# Patient Record
Sex: Male | Born: 1960 | Race: Asian | Hispanic: No | Marital: Married | Smoking: Never smoker
Health system: Southern US, Community
[De-identification: ages and names within clinical notes are randomized; demographics above are authoritative.]

## PROBLEM LIST (undated history)

## (undated) DIAGNOSIS — T7840XA Allergy, unspecified, initial encounter: Secondary | ICD-10-CM

## (undated) DIAGNOSIS — E119 Type 2 diabetes mellitus without complications: Secondary | ICD-10-CM

## (undated) DIAGNOSIS — I10 Essential (primary) hypertension: Secondary | ICD-10-CM

## (undated) DIAGNOSIS — R011 Cardiac murmur, unspecified: Secondary | ICD-10-CM

## (undated) DIAGNOSIS — E079 Disorder of thyroid, unspecified: Secondary | ICD-10-CM

## (undated) HISTORY — PX: COARCTATION OF AORTA EXCISION: SUR504

## (undated) HISTORY — DX: Allergy, unspecified, initial encounter: T78.40XA

## (undated) HISTORY — DX: Type 2 diabetes mellitus without complications: E11.9

## (undated) HISTORY — DX: Disorder of thyroid, unspecified: E07.9

## (undated) HISTORY — DX: Essential (primary) hypertension: I10

## (undated) HISTORY — DX: Cardiac murmur, unspecified: R01.1

---

## 2012-02-07 ENCOUNTER — Other Ambulatory Visit: Payer: Self-pay | Admitting: Internal Medicine

## 2012-05-04 ENCOUNTER — Other Ambulatory Visit: Payer: Self-pay | Admitting: Endocrinology

## 2012-05-04 DIAGNOSIS — E039 Hypothyroidism, unspecified: Secondary | ICD-10-CM

## 2012-05-08 ENCOUNTER — Other Ambulatory Visit: Payer: Self-pay | Admitting: Family Medicine

## 2012-05-09 NOTE — Telephone Encounter (Signed)
Needs office visit.

## 2012-05-15 ENCOUNTER — Other Ambulatory Visit: Payer: Self-pay

## 2012-09-13 ENCOUNTER — Ambulatory Visit (INDEPENDENT_AMBULATORY_CARE_PROVIDER_SITE_OTHER): Payer: 59 | Admitting: Family Medicine

## 2012-09-13 VITALS — BP 128/80 | HR 75 | Temp 98.3°F | Resp 16 | Ht 64.0 in | Wt 156.6 lb

## 2012-09-13 DIAGNOSIS — E119 Type 2 diabetes mellitus without complications: Secondary | ICD-10-CM

## 2012-09-13 DIAGNOSIS — E1165 Type 2 diabetes mellitus with hyperglycemia: Secondary | ICD-10-CM

## 2012-09-13 DIAGNOSIS — K219 Gastro-esophageal reflux disease without esophagitis: Secondary | ICD-10-CM

## 2012-09-13 DIAGNOSIS — E039 Hypothyroidism, unspecified: Secondary | ICD-10-CM

## 2012-09-13 DIAGNOSIS — E785 Hyperlipidemia, unspecified: Secondary | ICD-10-CM

## 2012-09-13 DIAGNOSIS — I10 Essential (primary) hypertension: Secondary | ICD-10-CM

## 2012-09-13 DIAGNOSIS — Z0000 Encounter for general adult medical examination without abnormal findings: Secondary | ICD-10-CM

## 2012-09-13 DIAGNOSIS — Z79899 Other long term (current) drug therapy: Secondary | ICD-10-CM

## 2012-09-13 HISTORY — DX: Essential (primary) hypertension: I10

## 2012-09-13 HISTORY — DX: Other long term (current) drug therapy: Z79.899

## 2012-09-13 HISTORY — DX: Type 2 diabetes mellitus with hyperglycemia: E11.65

## 2012-09-13 HISTORY — DX: Hypothyroidism, unspecified: E03.9

## 2012-09-13 HISTORY — DX: Hyperlipidemia, unspecified: E78.5

## 2012-09-13 LAB — COMPREHENSIVE METABOLIC PANEL
ALT: 17 U/L (ref 0–53)
AST: 16 U/L (ref 0–37)
Albumin: 4.6 g/dL (ref 3.5–5.2)
CO2: 28 mEq/L (ref 19–32)
Calcium: 9.5 mg/dL (ref 8.4–10.5)
Chloride: 103 mEq/L (ref 96–112)
Creat: 0.9 mg/dL (ref 0.50–1.35)
Potassium: 4.2 mEq/L (ref 3.5–5.3)

## 2012-09-13 LAB — LIPID PANEL: Cholesterol: 138 mg/dL (ref 0–200)

## 2012-09-13 LAB — TSH: TSH: 2.19 u[IU]/mL (ref 0.350–4.500)

## 2012-09-13 LAB — POCT GLYCOSYLATED HEMOGLOBIN (HGB A1C): Hemoglobin A1C: 5.4

## 2012-09-13 MED ORDER — LEVOTHYROXINE SODIUM 75 MCG PO TABS: 75.0000 ug | ORAL_TABLET | Freq: Every day | ORAL | Status: DC

## 2012-09-13 MED ORDER — LEVOXYL 75 MCG PO TABS: 75.0000 ug | ORAL_TABLET | Freq: Every day | ORAL | Status: DC

## 2012-09-13 MED ORDER — DESOXIMETASONE 0.25 % EX CREA: TOPICAL_CREAM | Freq: Two times a day (BID) | CUTANEOUS | Status: DC

## 2012-09-13 MED ORDER — OMEPRAZOLE 40 MG PO CPDR: 40.0000 mg | DELAYED_RELEASE_CAPSULE | Freq: Every day | ORAL | Status: DC

## 2012-09-13 NOTE — Progress Notes (Signed)
Subjective:    Patient ID: Troy Valentine, male    DOB: 16-Jun-1961, 51 y.o.   MRN: [id number]  HPI  Pt is a pleasant 51 yo male who is here for a routine physical for his insurance company.  He has DMII that is well controled on low dose metformin and followed by endocrinology along with his hypothyroid. He also has some well controlled HTN and HPL that is followed by cardiology.  He has appts with both of his specialists shortly but would appreciate med refill for 1 mo to last until his appt.  Past Medical History  Diagnosis Date  . Hypertension    History reviewed. No pertinent past surgical history. History reviewed. No pertinent family history. History  Substance Use Topics  . Smoking status: Never Smoker   . Smokeless tobacco: Not on file  . Alcohol Use: No   Current Outpatient Prescriptions on File Prior to Visit  Medication Sig Dispense Refill  . lisinopril (PRINIVIL,ZESTRIL) 20 MG tablet Take 20 mg by mouth daily.      . metFORMIN (GLUCOPHAGE-XR) 500 MG 24 hr tablet TAKE 2 TABLETS ONCE A DAY  60 tablet  0  . simvastatin (ZOCOR) 40 MG tablet Take 40 mg by mouth every evening.      Marland Kitchen omeprazole (PRILOSEC) 40 MG capsule Take 1 capsule (40 mg total) by mouth daily.  30 capsule  3  No Known Allergies    Review of Systems  Constitutional: Negative for fever, chills, activity change, appetite change and fatigue.  HENT: Negative for ear pain, congestion and sore throat.   Eyes: Negative for visual disturbance.  Respiratory: Negative for cough and shortness of breath.   Cardiovascular: Negative for chest pain and leg swelling.  Gastrointestinal: Negative for nausea, vomiting, abdominal pain, diarrhea and constipation.  Genitourinary: Negative for dysuria.  Musculoskeletal: Negative for myalgias, arthralgias and gait problem.  Skin: Negative for rash.  Neurological: Negative for dizziness, weakness, numbness and headaches.  Psychiatric/Behavioral: Negative for sleep  disturbance.      BP 128/80  Pulse 75  Temp 98.3 F (36.8 C) (Oral)  Resp 16  Ht 5\' 4"  (1.626 m)  Wt 156 lb 9.6 oz (71.033 kg)  BMI 26.88 kg/m2  SpO2 100% Objective:   Physical Exam  Constitutional: He is oriented to person, place, and time. He appears well-developed and well-nourished. No distress.  HENT:  Head: Normocephalic and atraumatic.  Right Ear: Tympanic membrane, external ear and ear canal normal.  Left Ear: Tympanic membrane, external ear and ear canal normal.  Nose: Nose normal.  Mouth/Throat: Uvula is midline, oropharynx is clear and moist and mucous membranes are normal. No oropharyngeal exudate.  Eyes: Conjunctivae normal are normal. Right eye exhibits no discharge. Left eye exhibits no discharge. No scleral icterus.  Neck: Normal range of motion. Neck supple. No thyromegaly present.  Cardiovascular: Normal rate, regular rhythm, normal heart sounds and intact distal pulses.   Pulmonary/Chest: Effort normal and breath sounds normal. No respiratory distress.  Abdominal: Soft. Bowel sounds are normal. He exhibits no distension and no mass. There is no tenderness. There is no rebound and no guarding.  Musculoskeletal: He exhibits no edema.  Lymphadenopathy:    He has no cervical adenopathy.  Neurological: He is alert and oriented to person, place, and time. He has normal reflexes. No cranial nerve deficit. He exhibits normal muscle tone.  Skin: Skin is warm and dry. No rash noted. He is not diaphoretic. No erythema.  Psychiatric: He has a normal  mood and affect. His behavior is normal.          Results for orders placed in visit on 09/13/12  POCT GLYCOSYLATED HEMOGLOBIN (HGB A1C)      Component Value Range   Hemoglobin A1C 5.4      Assessment & Plan:   1. Routine general medical examination at a health care facility  Nicotine/cotinine metabolites, PSA - Pt will be 51 yo within several mos and would like to go ahead and get a baseline PSA. If it is very low,  can monitor periodically.  Will need colonoscopy next yr.  2. Hyperlipidemia LDL goal < 100  Lipid panel  3. Hypertension    4. Hypothyroid  TSH, levothyroxine (SYNTHROID, LEVOTHROID) 75 MCG tablet  5. Diabetes mellitus  POCT glycosylated hemoglobin (Hb A1C), Microalbumin, urine.  Pt likely could go off his metformin - will defer to his prescribing endocrinologist.  6. Encounter for long-term (current) use of other medications  Comprehensive metabolic panel  7. GERD (gastroesophageal reflux disease)  omeprazole (PRILOSEC) 40 MG capsule

## 2012-09-14 LAB — MICROALBUMIN, URINE: Microalb, Ur: 0.7 mg/dL (ref 0.00–1.89)

## 2012-09-17 LAB — NICOTINE/COTININE METABOLITES: Cotinine: <10 ng/mL

## 2012-09-18 ENCOUNTER — Telehealth: Payer: Self-pay

## 2012-09-18 NOTE — Telephone Encounter (Signed)
Patient is calling to get the status of his lab result

## 2012-09-18 NOTE — Telephone Encounter (Signed)
Patient advised of normal labs, copy mailed to him

## 2012-12-07 ENCOUNTER — Telehealth: Payer: Self-pay

## 2012-12-07 DIAGNOSIS — E039 Hypothyroidism, unspecified: Secondary | ICD-10-CM

## 2012-12-07 NOTE — Telephone Encounter (Signed)
Pt needs for his prescriptions to be [facility]  States that the insurance company will not cover them at [facility].  levothyroxine (SYNTHROID, LEVOTHROID) 75 MCG tablet   CBN:  [phone]

## 2012-12-08 MED ORDER — LEVOTHYROXINE SODIUM 75 MCG PO TABS: 75.0000 ug | ORAL_TABLET | Freq: Every day | ORAL | Status: DC

## 2012-12-08 NOTE — Telephone Encounter (Signed)
LMOM to CB. 

## 2012-12-08 NOTE — Telephone Encounter (Signed)
Left message to advise

## 2012-12-08 NOTE — Telephone Encounter (Signed)
Which medications does pt need sent to mail order?  All of them or just synthroid?   And which mail order pharmacy?  When I search for "Medical Pharmacy", there are lots of options, want to make sure we get the right one

## 2012-12-08 NOTE — Telephone Encounter (Signed)
Rx sent to pharmacy   

## 2012-12-08 NOTE — Telephone Encounter (Signed)
PT WOULD LIKE FOR [name] TO SEND HIS LEVOTHYROXINE TO [facility] ([facility])

## 2013-02-08 ENCOUNTER — Telehealth: Payer: Self-pay

## 2013-02-08 NOTE — Telephone Encounter (Signed)
Needs refill on metaformin 500 mg 2 tabs per day. [facility]  [phone]

## 2013-02-09 NOTE — Telephone Encounter (Signed)
We do not follow the patient for this. Nor do we prescribe this for the patient. He will have to contact his endocrinologist for this. If they are out of town, someone is covering for them and they can discuss this with them.

## 2013-02-09 NOTE — Telephone Encounter (Signed)
Called patient to advise. Left message at number given, but unsure if this is him, greeting is male, patient is male.

## 2013-02-09 NOTE — Telephone Encounter (Signed)
Please advise, according to note Dr Clelia Croft wants him to get this medication from his endocrinologist. He did get from [name] on 05/2012. Please advise. Davey Bergsma

## 2013-02-12 NOTE — Telephone Encounter (Signed)
Left message again for him. Sent denial to mail order.

## 2013-02-13 ENCOUNTER — Telehealth: Payer: Self-pay

## 2013-02-13 NOTE — Telephone Encounter (Signed)
Spoke with pt, he states he is not seeing her endocrinologist and doesn't understand why he cannot get a refill on his Metformin from Dr Clelia Croft. Dr Clelia Croft please advise.

## 2013-02-14 MED ORDER — METFORMIN HCL ER 500 MG PO TB24: 500.0000 mg | ORAL_TABLET | Freq: Every day | ORAL | Status: DC

## 2013-02-14 NOTE — Telephone Encounter (Signed)
Patient called back and was advised.  

## 2013-02-14 NOTE — Telephone Encounter (Signed)
This is sent in. Left another message for him to call me back.

## 2013-02-14 NOTE — Telephone Encounter (Signed)
If he wants to transition his care here, i will put him on metformin xr 500mg  1 tab po qd. Disp 90, no refills. F/u in 2-3 mos for a1c check.

## 2013-02-14 NOTE — Telephone Encounter (Signed)
Thanks, I have called patient, previous message, per your office visit note he was to see endocrinology. Have called him, left message for him to call me back. Do you want him to continue the Metformin XR 500 2 x daily? Or do you want to decrease this? Since his A1C was 5.4

## 2013-02-14 NOTE — Telephone Encounter (Signed)
Read last OV note. At his last OV, he states that he was seeing endocrine and only wanted 1 mo rx'ed till his OV and I thought he should go off the metformin.  If he wants to transition his DM care here, ok to refill x 2 mos but then needs to sched f/u OV before further refills.

## 2013-05-14 ENCOUNTER — Other Ambulatory Visit: Payer: Self-pay | Admitting: Physician Assistant

## 2013-05-24 ENCOUNTER — Ambulatory Visit (INDEPENDENT_AMBULATORY_CARE_PROVIDER_SITE_OTHER): Payer: 59 | Admitting: Emergency Medicine

## 2013-05-24 VITALS — BP 110/78 | HR 65 | Temp 97.9°F | Resp 16 | Ht 64.0 in | Wt 156.0 lb

## 2013-05-24 DIAGNOSIS — E119 Type 2 diabetes mellitus without complications: Secondary | ICD-10-CM

## 2013-05-24 DIAGNOSIS — I10 Essential (primary) hypertension: Secondary | ICD-10-CM

## 2013-05-24 DIAGNOSIS — Z1211 Encounter for screening for malignant neoplasm of colon: Secondary | ICD-10-CM

## 2013-05-24 DIAGNOSIS — E782 Mixed hyperlipidemia: Secondary | ICD-10-CM

## 2013-05-24 DIAGNOSIS — E039 Hypothyroidism, unspecified: Secondary | ICD-10-CM

## 2013-05-24 DIAGNOSIS — E785 Hyperlipidemia, unspecified: Secondary | ICD-10-CM

## 2013-05-24 LAB — COMPREHENSIVE METABOLIC PANEL
ALT: 21 U/L (ref 0–53)
AST: 19 U/L (ref 0–37)
Calcium: 9.8 mg/dL (ref 8.4–10.5)
Chloride: 102 mEq/L (ref 96–112)
Creat: 1.01 mg/dL (ref 0.50–1.35)
Potassium: 4.2 mEq/L (ref 3.5–5.3)
Sodium: 137 mEq/L (ref 135–145)
Total Protein: 7.1 g/dL (ref 6.0–8.3)

## 2013-05-24 LAB — LIPID PANEL: Total CHOL/HDL Ratio: 3.4 Ratio

## 2013-05-24 LAB — PSA: PSA: 0.38 ng/mL (ref <=4.00)

## 2013-05-24 LAB — TSH: TSH: 3.404 u[IU]/mL (ref 0.350–4.500)

## 2013-05-24 LAB — HEMOGLOBIN A1C: Mean Plasma Glucose: 111 mg/dL (ref <117)

## 2013-05-24 LAB — TESTOSTERONE: Testosterone: 404 ng/dL (ref 300–890)

## 2013-05-24 NOTE — Progress Notes (Signed)
[facility] [address], [place] [address] [phone]  Date:  05/24/2013   Name:  Troy Valentine   DOB:  [birth year]   MRN:  [id number]  PCP:  No primary provider on file.    Chief Complaint: Diabetes   History of Present Illness:  Troy Valentine is a 52 y.o. very pleasant male patient who presents with the following:  52 year old man with NIDDM and hypothyroidism.  Last seen 11/13.  No evidence for end organ damage.  Tolerating medication well.  Has not had colonoscopy  Denies other complaint or health concern today.   Patient Active Problem List   Diagnosis Date Noted  . Hyperlipidemia LDL goal < 100 09/13/2012  . Hypertension 09/13/2012  . Hypothyroid 09/13/2012  . Diabetes mellitus 09/13/2012  . Encounter for long-term (current) use of other medications 09/13/2012    Past Medical History  Diagnosis Date  . Hypertension     History reviewed. No pertinent past surgical history.  History  Substance Use Topics  . Smoking status: Never Smoker   . Smokeless tobacco: Not on file  . Alcohol Use: No    History reviewed. No pertinent family history.  No Known Allergies  Medication list has been reviewed and updated.  Current Outpatient Prescriptions on File Prior to Visit  Medication Sig Dispense Refill  . aspirin 81 MG tablet Take 81 mg by mouth daily.      Marland Kitchen desoximetasone (TOPICORT) 0.25 % cream Apply topically 2 (two) times daily.  100 g  2  . levothyroxine (SYNTHROID, LEVOTHROID) 75 MCG tablet TAKE 1 TABLET DAILY  15 tablet  0  . lisinopril (PRINIVIL,ZESTRIL) 20 MG tablet Take 20 mg by mouth daily.      . metFORMIN (GLUCOPHAGE-XR) 500 MG 24 hr tablet Take 1 tablet (500 mg total) by mouth daily with breakfast.  90 tablet  0  . simvastatin (ZOCOR) 40 MG tablet Take 40 mg by mouth every evening.      Marland Kitchen omeprazole (PRILOSEC) 40 MG capsule Take 1 capsule (40 mg total) by mouth daily.  30 capsule  3   No current  facility-administered medications on file prior to visit.    Review of Systems:  As per HPI, otherwise negative.    Physical Examination: Filed Vitals:   05/24/13 0828  BP: 110/78  Pulse: 65  Temp: 97.9 F (36.6 C)  Resp: 16   Filed Vitals:   05/24/13 0828  Height: 5\' 4"  (1.626 m)  Weight: 156 lb (70.761 kg)   Body mass index is 26.76 kg/(m^2). Ideal Body Weight: Weight in (lb) to have BMI = 25: 145.3  GEN: WDWN, NAD, Non-toxic, A & O x 3 HEENT: Atraumatic, Normocephalic. Neck supple. No masses, No LAD. Ears and Nose: No external deformity. CV: RRR, No M/G/R. No JVD. No thrill. No extra heart sounds. PULM: CTA B, no wheezes, crackles, rhonchi. No retractions. No resp. distress. No accessory muscle use. ABD: S, NT, ND, +BS. No rebound. No HSM. EXTR: No c/c/e NEURO Normal gait.  PSYCH: Normally interactive. Conversant. Not depressed or anxious appearing.  Calm demeanor.    Assessment and Plan: NIDDM Hypothyroid Colonoscopy referral Labs pending Follow up based on labs   Signed,  Phillips Odor, MD

## 2013-05-24 NOTE — Patient Instructions (Addendum)
Type 2 Diabetes Mellitus, Adult Type 2 diabetes mellitus, often simply referred to as type 2 diabetes, is a long-lasting (chronic) disease. In type 2 diabetes, the pancreas does not make enough insulin (a hormone), the cells are less responsive to the insulin that is made (insulin resistance), or both. Normally, insulin moves sugars from food into the tissue cells. The tissue cells use the sugars for energy. The lack of insulin or the lack of normal response to insulin causes excess sugars to build up in the blood instead of going into the tissue cells. As a result, high blood sugar (hyperglycemia) develops. The effect of high sugar (glucose) levels can cause many complications. Type 2 diabetes was also previously called adult-onset diabetes but it can occur at any age.  RISK FACTORS  A person is predisposed to developing type 2 diabetes if someone in the family has the disease and also has one or more of the following primary risk factors:  Overweight.  An inactive lifestyle.  A history of consistently eating high-calorie foods. Maintaining a normal weight and regular physical activity can reduce the chance of developing type 2 diabetes. SYMPTOMS  A person with type 2 diabetes may not show symptoms initially. The symptoms of type 2 diabetes appear slowly. The symptoms include:  Increased thirst (polydipsia).  Increased urination (polyuria).  Increased urination during the night (nocturia).  Weight loss. This weight loss may be rapid.  Frequent, recurring infections.  Tiredness (fatigue).  Weakness.  Vision changes, such as blurred vision.  Fruity smell to your breath.  Abdominal pain.  Nausea or vomiting.  Cuts or bruises which are slow to heal.  Tingling or numbness in the hands or feet. DIAGNOSIS Type 2 diabetes is frequently not diagnosed until complications of diabetes are present. Type 2 diabetes is diagnosed when symptoms or complications are present and when  blood glucose levels are increased. Your blood glucose level may be checked by one or more of the following blood tests:  A fasting blood glucose test. You will not be allowed to eat for at least 8 hours before a blood sample is taken.  A random blood glucose test. Your blood glucose is checked at any time of the day regardless of when you ate.  A hemoglobin A1c blood glucose test. A hemoglobin A1c test provides information about blood glucose control over the previous 3 months.  An oral glucose tolerance test (OGTT). Your blood glucose is measured after you have not eaten (fasted) for 2 hours and then after you drink a glucose-containing beverage. TREATMENT   You may need to take insulin or diabetes medicine daily to keep blood glucose levels in the desired range.  You will need to match insulin dosing with exercise and healthy food choices. The treatment goal is to maintain the before meal blood sugar (preprandial glucose) level at 70 130 mg/dL. HOME CARE INSTRUCTIONS   Have your hemoglobin A1c level checked twice a year.  Perform daily blood glucose monitoring as directed by your caregiver.  Monitor urine ketones when you are ill and as directed by your caregiver.  Take your diabetes medicine or insulin as directed by your caregiver to maintain your blood glucose levels in the desired range.  Never run out of diabetes medicine or insulin. It is needed every day.  Adjust insulin based on your intake of carbohydrates. Carbohydrates can raise blood glucose levels but need to be included in your diet. Carbohydrates provide vitamins, minerals, and fiber which are an essential part of  a healthy diet. Carbohydrates are found in fruits, vegetables, whole grains, dairy products, legumes, and foods containing added sugars.    Eat healthy foods. Alternate 3 meals with 3 snacks.  Lose weight if overweight.  Carry a medical alert card or wear your medical alert jewelry.  Carry a 15 gram  carbohydrate snack with you at all times to treat low blood glucose (hypoglycemia). Some examples of 15 gram carbohydrate snacks include:  Glucose tablets, 3 or 4   Glucose gel, 15 gram tube  Raisins, 2 tablespoons (24 grams)  Jelly beans, 6  Animal crackers, 8  Regular pop, 4 ounces (120 mL)  Gummy treats, 9  Recognize hypoglycemia. Hypoglycemia occurs with blood glucose levels of 70 mg/dL and below. The risk for hypoglycemia increases when fasting or skipping meals, during or after intense exercise, and during sleep. Hypoglycemia symptoms can include:  Tremors or shakes.  Decreased ability to concentrate.  Sweating.  Increased heart rate.  Headache.  Dry mouth.  Hunger.  Irritability.  Anxiety.  Restless sleep.  Altered speech or coordination.  Confusion.  Treat hypoglycemia promptly. If you are alert and able to safely swallow, follow the 15:15 rule:  Take 15 20 grams of rapid-acting glucose or carbohydrate. Rapid-acting options include glucose gel, glucose tablets, or 4 ounces (120 mL) of fruit juice, regular soda, or low fat milk.  Check your blood glucose level 15 minutes after taking the glucose.  Take 15 20 grams more of glucose if the repeat blood glucose level is still 70 mg/dL or below.  Eat a meal or snack within 1 hour once blood glucose levels return to normal.    Be alert to polyuria and polydipsia which are early signs of hyperglycemia. An early awareness of hyperglycemia allows for prompt treatment. Treat hyperglycemia as directed by your caregiver.  Engage in at least 150 minutes of moderate-intensity physical activity a week, spread over at least 3 days of the week or as directed by your caregiver. In addition, you should engage in resistance exercise at least 2 times a week or as directed by your caregiver.  Adjust your medicine and food intake as needed if you start a new exercise or sport.  Follow your sick day plan at any time you  are unable to eat or drink as usual.  Avoid tobacco use.  Limit alcohol intake to no more than 1 drink per day for nonpregnant women and 2 drinks per day for men. You should drink alcohol only when you are also eating food. Talk with your caregiver whether alcohol is safe for you. Tell your caregiver if you drink alcohol several times a week.  Follow up with your caregiver regularly.  Schedule an eye exam soon after the diagnosis of type 2 diabetes and then annually.  Perform daily skin and foot care. Examine your skin and feet daily for cuts, bruises, redness, nail problems, bleeding, blisters, or sores. A foot exam by a caregiver should be done annually.  Brush your teeth and gums at least twice a day and floss at least once a day. Follow up with your dentist regularly.  Share your diabetes management plan with your workplace or school.  Stay up-to-date with immunizations.  Learn to manage stress.  Obtain ongoing diabetes education and support as needed.  Participate in, or seek rehabilitation as needed to maintain or improve independence and quality of life. Request a physical or occupational therapy referral if you are having foot or hand numbness or difficulties with grooming,   dressing, eating, or physical activity. SEEK MEDICAL CARE IF:   You are unable to eat food or drink fluids for more than 6 hours.  You have nausea and vomiting for more than 6 hours.  Your blood glucose level is over 240 mg/dL.  There is a change in mental status.  You develop an additional serious illness.  You have diarrhea for more than 6 hours.  You have been sick or have had a fever for a couple of days and are not getting better.  You have pain during any physical activity.  SEEK IMMEDIATE MEDICAL CARE IF:  You have difficulty breathing.  You have moderate to large ketone levels. MAKE SURE YOU:  Understand these instructions.  Will watch your condition.  Will get help right away if  you are not doing well or get worse. Document Released: 10/25/2005 Document Revised: 07/19/2012 Document Reviewed: 05/23/2012 [facility] Patient Information 2014 [place], [place].  Thank you for enrolling in MyChart. Please follow the instructions below to securely access your online medical record. MyChart allows you to send messages to your doctor, view your test results, manage appointments, and more.   How Do I Sign Up? 1. In your Internet browser, go to [employer] and enter [email or web address]. 2. Click on the Sign Up Now link in the Sign In box. You will see the New Member Sign Up page. 3. Enter your MyChart Access Code exactly as it appears below. You will not need to use this code after you've completed the sign-up process. If you do not sign up before the expiration date, you must request a new code. MyChart Access Code: [email or web address] Expires: 06/23/2013  7:56 AM  4. Enter your Social Security Number ([id number]) and Date of Birth (mm/dd/yyyy) as indicated and click Submit. You will be taken to the next sign-up page. 5. Create a MyChart ID. This will be your MyChart login ID and cannot be changed, so think of one that is secure and easy to remember. 6. Create a MyChart password. You can change your password at any time. 7. Enter your Password Reset Question and Answer. This can be used at a later time if you forget your password.  8. Enter your e-mail address. You will receive e-mail notification when new information is available in MyChart. 9. Click Sign Up. You can now view your medical record.   Additional Information Remember, MyChart is NOT to be used for urgent needs. For medical emergencies, dial 911.

## 2013-05-24 NOTE — Addendum Note (Signed)
Addended by: Gerrianne Scale on: 05/24/2013 09:20 AM   Modules accepted: Orders

## 2013-05-24 NOTE — Addendum Note (Signed)
Addended by: Carmelina Dane on: 05/24/2013 09:14 AM   Modules accepted: Orders

## 2013-05-25 MED ORDER — SIMVASTATIN 40 MG PO TABS: 40.0000 mg | ORAL_TABLET | Freq: Every evening | ORAL | Status: DC

## 2013-05-25 MED ORDER — LISINOPRIL 20 MG PO TABS: 20.0000 mg | ORAL_TABLET | Freq: Every day | ORAL | Status: DC

## 2013-05-25 MED ORDER — METFORMIN HCL ER 500 MG PO TB24: 500.0000 mg | ORAL_TABLET | Freq: Every day | ORAL | Status: DC

## 2013-05-25 MED ORDER — LEVOTHYROXINE SODIUM 75 MCG PO TABS: 75.0000 ug | ORAL_TABLET | Freq: Every day | ORAL | Status: DC

## 2013-05-25 NOTE — Addendum Note (Signed)
Addended by: Carmelina Dane on: 05/25/2013 08:29 AM   Modules accepted: Orders

## 2013-05-30 ENCOUNTER — Other Ambulatory Visit: Payer: Self-pay

## 2013-05-30 MED ORDER — METFORMIN HCL ER 500 MG PO TB24: 500.0000 mg | ORAL_TABLET | Freq: Every day | ORAL | Status: DC

## 2013-05-31 ENCOUNTER — Telehealth: Payer: Self-pay

## 2013-05-31 NOTE — Telephone Encounter (Signed)
Pt is needing to get metphormin and levothroid called [facility]   Best number is [phone]

## 2013-06-01 ENCOUNTER — Telehealth: Payer: Self-pay

## 2013-06-01 NOTE — Telephone Encounter (Signed)
Patient calling to get a refill on metphormin and levothroid called into [facility].   [phone]

## 2013-06-02 MED ORDER — METFORMIN HCL ER 500 MG PO TB24: 500.0000 mg | ORAL_TABLET | Freq: Every day | ORAL | Status: DC

## 2013-06-02 MED ORDER — LEVOTHYROXINE SODIUM 75 MCG PO TABS: 75.0000 ug | ORAL_TABLET | Freq: Every day | ORAL | Status: DC

## 2013-06-02 NOTE — Telephone Encounter (Signed)
Pt will be in [place] before [employer] can mail him his meds and wants to know if we can send a 30 day supply in to [facility]. Per Dr. Dareen Piano this is OK

## 2013-06-04 NOTE — Telephone Encounter (Signed)
Dr. Dareen Piano authorized these 06/02/2013.

## 2013-08-01 ENCOUNTER — Encounter: Payer: Self-pay | Admitting: Emergency Medicine

## 2013-08-23 ENCOUNTER — Ambulatory Visit (INDEPENDENT_AMBULATORY_CARE_PROVIDER_SITE_OTHER): Payer: 59 | Admitting: Internal Medicine

## 2013-08-23 VITALS — BP 110/70 | HR 70 | Temp 98.5°F | Resp 16 | Ht 63.75 in | Wt 157.0 lb

## 2013-08-23 DIAGNOSIS — Z79899 Other long term (current) drug therapy: Secondary | ICD-10-CM

## 2013-08-23 DIAGNOSIS — E039 Hypothyroidism, unspecified: Secondary | ICD-10-CM

## 2013-08-23 DIAGNOSIS — E785 Hyperlipidemia, unspecified: Secondary | ICD-10-CM

## 2013-08-23 DIAGNOSIS — I10 Essential (primary) hypertension: Secondary | ICD-10-CM

## 2013-08-23 DIAGNOSIS — Z23 Encounter for immunization: Secondary | ICD-10-CM

## 2013-08-23 DIAGNOSIS — Z0000 Encounter for general adult medical examination without abnormal findings: Secondary | ICD-10-CM

## 2013-08-23 LAB — POCT URINALYSIS DIPSTICK
Blood, UA: NEGATIVE
Glucose, UA: NEGATIVE
Protein, UA: NEGATIVE
Spec Grav, UA: 1.01
Urobilinogen, UA: 0.2
pH, UA: 6

## 2013-08-23 LAB — POCT UA - MICROSCOPIC ONLY
Casts, Ur, LPF, POC: NEGATIVE
Crystals, Ur, HPF, POC: NEGATIVE
Epithelial cells, urine per micros: NEGATIVE

## 2013-08-23 LAB — POCT CBC
HCT, POC: 48.5 % (ref 43.5–53.7)
Hemoglobin: 15.9 g/dL (ref 14.1–18.1)
MCH, POC: 29.6 pg (ref 27–31.2)
MPV: 9.1 fL (ref 0–99.8)
POC MID %: 6.8 %M (ref 0–12)
RBC: 5.37 M/uL (ref 4.69–6.13)
WBC: 7.1 10*3/uL (ref 4.6–10.2)

## 2013-08-23 NOTE — Progress Notes (Signed)
  Subjective:    Patient ID: Troy Valentine, male    DOB: 1961/04/03, 52 y.o.   MRN: [id number]  HPI 52 y.o. Male presents to clinic today for wellness physical. Currently taking lisinopril for BP, pravastatin and metformin for glucose intolerance. Denies any history of heart attack or cancer. Was seen approx. One month ago due to fatigue.   Review of Systems  Constitutional: Negative.   HENT: Negative.   Eyes: Negative.   Respiratory: Negative.   Cardiovascular: Negative.   Gastrointestinal: Negative.   Endocrine: Negative.   Genitourinary: Negative.   Musculoskeletal: Negative.   Skin: Negative.   Allergic/Immunologic: Negative.   Neurological: Negative.   Hematological: Negative.   Psychiatric/Behavioral: Negative.        Objective:   Physical Exam  Vitals reviewed. Constitutional: He is oriented to person, place, and time. He appears well-developed and well-nourished. No distress.  HENT:  Head: Normocephalic.  Right Ear: External ear normal.  Left Ear: External ear normal.  Nose: Nose normal.  Mouth/Throat: Oropharynx is clear and moist.  Eyes: Conjunctivae and EOM are normal. Pupils are equal, round, and reactive to light.  Neck: Normal range of motion. Neck supple. No tracheal deviation present. No thyromegaly present.  Cardiovascular: Normal rate, normal heart sounds and intact distal pulses.   No murmur heard. Pulmonary/Chest: Effort normal and breath sounds normal.  Abdominal: Soft. Bowel sounds are normal. He exhibits no mass. There is no tenderness.  Genitourinary: Rectum normal, prostate normal and penis normal.  Musculoskeletal: Normal range of motion.  Neurological: He is alert and oriented to person, place, and time. He has normal reflexes. No cranial nerve deficit. He exhibits normal muscle tone. Coordination normal.  Skin: No rash noted.  Psychiatric: He has a normal mood and affect. His behavior is normal. Judgment and thought content normal.           Assessment & Plan:  NIDDM controlled/HTN controlled/Thyroid controlled/Lipids controlled CPE normal/Refer for first colonoscopy

## 2013-08-23 NOTE — Patient Instructions (Addendum)
1500 Calorie Diabetic Diet The 1500 calorie diabetic diet limits calories to 1500 each day. Following this diet and making healthy meal choices can help improve overall health. It controls blood glucose (sugar) levels and can also help lower blood pressure and cholesterol.  SERVING SIZES Measuring foods and serving sizes helps to make sure you are getting the right amount of food. The list below tells how big or small some common serving sizes are.  1 oz.........4 stacked dice.  3 oz.........Deck of cards.  1 tsp........Tip of little finger.  1 tbs........Thumb.  2 tbs........Golf ball.   cup.......Half of a fist.  1 cup........A fist. GUIDELINES FOR CHOOSING FOODS The goal of this diet is to eat a variety of foods and limit calories to 1500 each day. This can be done by choosing foods that are low in calories and fat. The diet also suggests eating small amounts of food frequently. Doing this helps control your blood glucose levels, so they do not get too high or too low. Each meal or snack may include a protein food source to help you feel more satisfied. Try to eat about the same amount of food around the same time each day. This includes weekend days, travel days, and days off work. Space your meals about 4 to 5 hours apart, and add a snack between them, if you wish.  For example, a daily food plan could include breakfast, a morning snack, lunch, dinner, and an evening snack. Healthy meals and snacks have different types of foods, including whole grains, vegetables, fruits, lean meats, poultry, fish, and dairy products. As you plan your meals, select a variety of foods. Choose from the bread and starch, vegetable, fruit, dairy, and meat/protein groups. Examples of foods from each group are listed below, with their suggested serving sizes. Use measuring cups and spoons to become familiar with what a healthy portion looks like. Bread and Starch Each serving equals 15 grams of  carbohydrate.  1 slice bread.   bagel.   cup cold cereal (unsweetened).   cup hot cereal or mashed potatoes.  1 small potato (size of a computer mouse).   cup cooked pasta or rice.   English muffin.  1 cup broth-based soup.  3 cups of popcorn.  4 to 6 whole-wheat crackers.   cup cooked beans, peas, or corn. Vegetables Each serving equals 5 grams of carbohydrate.   cup cooked vegetables.  1 cup raw vegetables.   cup tomato or vegetable juice. Fruit Each serving equals 15 grams of carbohydrate.  1 small apple or orange.  1  cup watermelon or strawberries.   cup applesauce (no sugar added).  2 tbs raisins.   banana.   cup canned fruit, packed in water or in its own juice.   cup unsweetened fruit juice. Dairy Each serving equals 12 to 15 grams of carbohydrate.  1 cup fat-free milk.  6 oz artificially sweetened yogurt or plain yogurt.  1 cup low-fat buttermilk.  1 cup soy milk.  1 cup almond milk. Meat/Protein  1 large egg.  2 to 3 oz meat, poultry, or fish.   cup low-fat cottage cheese.  1 tbs peanut butter.  1 oz low-fat cheese.   cup tuna, packed in water.   cup tofu. Fat  1 tsp oil.  1 tsp trans-fat-free margarine.  1 tsp butter.  1 tsp mayonnaise.  2 tbs avocado.  1 tbs salad dressing.  1 tbs cream cheese.  2 tbs sour cream. SAMPLE 1500 CALORIE DIET   PLAN Breakfast   whole-wheat English muffin (1 carb serving).  1 tsp trans-fat-free margarine.  1 scrambled egg.  1 cup fat-free milk (1 carb serving).  1 small orange (1 carb serving). Lunch  Chicken wrap.  1 whole-wheat tortilla, 8-inch (1 carb servings).  2 oz chicken breast, sliced.  2 tbs low-fat salad dressing, such as Troy Valentine.   cup shredded lettuce.  2 slices tomato.   cup carrot sticks.  1 small apple (1 carb serving). Afternoon Snack  3 graham cracker squares (1 carb serving).  1 tbs peanut butter. Dinner  2 oz lean  pork chop, broiled.  1 cup brown rice (3 carb servings).   cup steamed carrots.   cup green beans.  1 cup fat-free milk (1 carb serving).  1 tsp trans-fat-free margarine. Evening Snack   cup low-fat cottage cheese.  1 small peach or pear, sliced (or  cup canned in water) (1 carb serving). MEAL PLAN You can use this worksheet to help you make a daily meal plan based on the 1500 calorie diabetic diet suggestions. If you are using this plan to help you control your blood glucose, you may interchange carbohydrate containing foods (dairy, starches, and fruits). Select a variety of fresh foods of varying colors and flavors. The total amount of carbohydrate in your meals or snacks is more important than making sure you include all of the food groups every time you eat. You can choose from approximately this many of the following foods to build your day's meals:  6 Starches.  3 Vegetables.  2 Fruits.  2 Dairy.  4 to 6 oz Meat/Protein.  Up to 3 Fats. Your dietician can use this worksheet to help you decide how many servings and which types of foods are right for you. BREAKFAST Food Group and Servings / Food Choice Starch _________________________________________________________ Dairy __________________________________________________________ Fruit ___________________________________________________________ Meat/Protein____________________________________________________ Fat ____________________________________________________________ LUNCH Food Group and Servings / Food Choice  Starch _________________________________________________________ Meat/Protein ___________________________________________________ Vegetables _____________________________________________________ Fruit __________________________________________________________ Dairy __________________________________________________________ Fat ____________________________________________________________ Troy Valentine Food Group and Servings / Food Choice Dairy __________________________________________________________ Starch _________________________________________________________ Meat/Protein____________________________________________________ Troy Valentine ___________________________________________________________ Troy Valentine Food Group and Servings / Food Choice Starch _________________________________________________________ Meat/Protein ___________________________________________________ Dairy __________________________________________________________ Vegetable ______________________________________________________ Fruit ___________________________________________________________ Fat ____________________________________________________________ Lollie Sails Food Group and Servings / Food Choice Fruit ___________________________________________________________ Meat/Protein ____________________________________________________ Dairy __________________________________________________________ Starch __________________________________________________________ DAILY TOTALS Starches _________________________ Vegetables _______________________ Fruits ____________________________ Dairy ____________________________ Meat/Protein_____________________ Fats _____________________________ Document Released: 05/17/2005 Document Revised: 01/17/2012 Document Reviewed: 09/11/2009 [employer] Patient Information 2014 [employer] Hypothyroidism The thyroid is a large gland located in the lower front of your neck. The thyroid gland helps control metabolism. Metabolism is how your body handles food. It controls metabolism with the hormone thyroxine. When this gland is underactive (hypothyroid), it produces too little hormone.  CAUSES These include:   Absence or destruction of thyroid tissue.  Goiter due to iodine deficiency.  Goiter due to medications.  Congenital defects (since birth).  Problems with the  pituitary. This causes a lack of TSH (thyroid stimulating hormone). This hormone tells the thyroid to turn out more hormone. SYMPTOMS  Lethargy (feeling as though you have no energy)  Cold intolerance  Weight gain (in spite of normal food intake)  Dry skin  Coarse hair  Menstrual irregularity (if severe, may lead to infertility)  Slowing of thought processes Cardiac problems are also caused by insufficient amounts of thyroid hormone. Hypothyroidism in the newborn is cretinism, and is an extreme form. It is important that this form be treated adequately and immediately  or it will lead rapidly to retarded physical and mental development. DIAGNOSIS  To prove hypothyroidism, your caregiver may do blood tests and ultrasound tests. Sometimes the signs are hidden. It may be necessary for your caregiver to watch this illness with blood tests either before or after diagnosis and treatment. TREATMENT  Low levels of thyroid hormone are increased by using synthetic thyroid hormone. This is a safe, effective treatment. It usually takes about four weeks to gain the full effects of the medication. After you have the full effect of the medication, it will generally take another four weeks for problems to leave. Your caregiver may start you on low doses. If you have had heart problems the dose may be gradually increased. It is generally not an emergency to get rapidly to normal. HOME CARE INSTRUCTIONS   Take your medications as your caregiver suggests. Let your caregiver know of any medications you are taking or start taking. Your caregiver will help you with dosage schedules.  As your condition improves, your dosage needs may increase. It will be necessary to have continuing blood tests as suggested by your caregiver.  Report all suspected medication side effects to your caregiver. SEEK MEDICAL CARE IF: Seek medical care if you develop:  Sweating.  Tremulousness (tremors).  Anxiety.  Rapid  weight loss.  Heat intolerance.  Emotional swings.  Diarrhea.  Weakness. SEEK IMMEDIATE MEDICAL CARE IF:  You develop chest pain, an irregular heart beat (palpitations), or a rapid heart beat. MAKE SURE YOU:   Understand these instructions.  Will watch your condition.  Will get help right away if you are not doing well or get worse. Document Released: 10/25/2005 Document Revised: 01/17/2012 Document Reviewed: 06/14/2008 [facility] Patient Information 2014 [place], [place].

## 2013-08-24 ENCOUNTER — Encounter: Payer: Self-pay | Admitting: Family Medicine

## 2013-08-24 LAB — NICOTINE/COTININE METABOLITES: Cotinine: <10 ng/mL

## 2013-08-24 LAB — MICROALBUMIN, URINE: Microalb, Ur: 0.5 mg/dL (ref 0.00–1.89)

## 2013-08-30 ENCOUNTER — Telehealth: Payer: Self-pay

## 2013-08-30 NOTE — Telephone Encounter (Signed)
Called patient to advise  °

## 2013-08-30 NOTE — Telephone Encounter (Signed)
Labs normal. Send letter to patient

## 2013-08-30 NOTE — Telephone Encounter (Signed)
Patient was seen last week for a CPE. Still waiting on lab results.  [phone]

## 2013-11-09 ENCOUNTER — Other Ambulatory Visit: Payer: Self-pay | Admitting: Emergency Medicine

## 2014-02-07 ENCOUNTER — Other Ambulatory Visit: Payer: Self-pay | Admitting: Internal Medicine

## 2014-04-17 ENCOUNTER — Ambulatory Visit (INDEPENDENT_AMBULATORY_CARE_PROVIDER_SITE_OTHER): Payer: 59 | Admitting: Family Medicine

## 2014-04-17 VITALS — BP 110/80 | HR 86 | Temp 98.2°F | Ht 63.5 in | Wt 159.2 lb

## 2014-04-17 DIAGNOSIS — J988 Other specified respiratory disorders: Secondary | ICD-10-CM

## 2014-04-17 DIAGNOSIS — E039 Hypothyroidism, unspecified: Secondary | ICD-10-CM

## 2014-04-17 DIAGNOSIS — E119 Type 2 diabetes mellitus without complications: Secondary | ICD-10-CM

## 2014-04-17 DIAGNOSIS — E785 Hyperlipidemia, unspecified: Secondary | ICD-10-CM

## 2014-04-17 DIAGNOSIS — J22 Unspecified acute lower respiratory infection: Secondary | ICD-10-CM

## 2014-04-17 DIAGNOSIS — R059 Cough, unspecified: Secondary | ICD-10-CM

## 2014-04-17 DIAGNOSIS — R05 Cough: Secondary | ICD-10-CM

## 2014-04-17 MED ORDER — BENZONATATE 100 MG PO CAPS: 200.0000 mg | ORAL_CAPSULE | Freq: Two times a day (BID) | ORAL | Status: DC | PRN

## 2014-04-17 MED ORDER — SIMVASTATIN 40 MG PO TABS
40.0000 mg | ORAL_TABLET | Freq: Every evening | ORAL | Status: DC
Start: 2014-04-17 — End: 2014-09-21

## 2014-04-17 MED ORDER — LEVOTHYROXINE SODIUM 75 MCG PO TABS: 75.0000 ug | ORAL_TABLET | Freq: Every day | ORAL | Status: DC

## 2014-04-17 MED ORDER — METFORMIN HCL ER 500 MG PO TB24: 500.0000 mg | ORAL_TABLET | Freq: Every day | ORAL | Status: DC

## 2014-04-17 MED ORDER — HYDROCODONE-HOMATROPINE 5-1.5 MG/5ML PO SYRP: 5.0000 mL | ORAL_SOLUTION | Freq: Every evening | ORAL | Status: DC | PRN

## 2014-04-17 MED ORDER — AZITHROMYCIN 250 MG PO TABS: ORAL_TABLET | ORAL | Status: DC

## 2014-04-17 NOTE — Progress Notes (Signed)
Chief Complaint:  Chief Complaint  Patient presents with  . URI    x 3 weeks    HPI: Troy Valentine is a 53 y.o. male who is here for  3 week history of cough, he states he had it prior to going to [place] and thought the cough was almost gone and then when he came back to [place] 3 days ago  from [place] visiting family he was sick again with similar sxs. He has allergies but has been taking otc meds. He has no fevers or chills. He has some chest wall tightness when he is coughing but no sxs of DVT or PE. He has no risk factors for PE or DVT ex ept they did take a long drive to [place]. He denies weakness, fatigue, SOB, extremity pain or asymmetric swelling, malignancy , trauma/surgery or prior DVT/PE. He has productive cough, green/yellow.He works in nursing home, doing PT. He is UTD on his pertussis and all vaccines. Nonsmoker. 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 He also has GERD, has been on ACEI without prior dry cough issues  He also would like med refills. He has been comliant without SEs UTD on eye exam, denies neuropathy, denies dizziness, msk aches, CP  Past Medical History  Diagnosis Date  . Hypertension   . Allergy   . Diabetes mellitus without complication   . Heart murmur   . Thyroid disease    Past Surgical History  Procedure Laterality Date  . Coarctation of aorta excision     History   Social History  . Marital Status: Married    Spouse Name: N/A    Number of Children: N/A  . Years of Education: N/A   Social History Main Topics  . Smoking status: Never Smoker   . Smokeless tobacco: None  . Alcohol Use: No  . Drug Use: No  . Sexual Activity: Yes    Birth Control/ Protection: None   Other Topics Concern  . None   Social History Narrative  . None   Family History  Problem Relation Age of Onset  . Hypertension Mother    No Known Allergies Prior to Admission medications   Medication Sig  Start Date End Date Taking? Authorizing Provider  aspirin 81 MG tablet Take 81 mg by mouth daily.   Yes Historical Provider, MD  desoximetasone (TOPICORT) 0.25 % cream Apply topically 2 (two) times daily. 09/13/12  Yes Sherren Mocha, MD  levothyroxine (SYNTHROID, LEVOTHROID) 75 MCG tablet Take 1 tablet (75 mcg total) by mouth daily. PATIENT NEEDS OFFICE VISIT FOR ADDITIONAL REFILLS 02/07/14  Yes Jonita Albee, MD  lisinopril (PRINIVIL,ZESTRIL) 20 MG tablet Take 1 tablet (20 mg total) by mouth daily. 05/25/13  Yes Phillips Odor, MD  metFORMIN (GLUCOPHAGE-XR) 500 MG 24 hr tablet Take 1 tablet (500 mg total) by mouth daily. PATIENT NEEDS OFFICE VISIT FOR ADDITIONAL REFILLS 02/07/14  Yes Jonita Albee, MD  omeprazole (PRILOSEC) 40 MG capsule Take 1 capsule (40 mg total) by mouth daily. 09/13/12  Yes Sherren Mocha, MD  simvastatin (ZOCOR) 40 MG tablet Take 1 tablet (40 mg total) by mouth every evening. 05/25/13  Yes Phillips Odor, MD     ROS: The patient denies fevers, chills, night sweats, unintentional weight loss, chest pain, palpitations, wheezing, dyspnea on exertion, nausea, vomiting, abdominal pain, dysuria, hematuria, melena, numbness, weakness, or tingling.   All other systems have been reviewed and were  otherwise negative with the exception of those mentioned in the HPI and as above.    PHYSICAL EXAM: Filed Vitals:   04/17/14 2031  BP: 110/80  Pulse: 86  Temp: 98.2 F (36.8 C)  SPo2 98%  Filed Vitals:   04/17/14 2031  Height: 5' 3.5" (1.613 m)  Weight: 159 lb 3.2 oz (72.213 kg)   Body mass index is 27.76 kg/(m^2).  General: Alert, no acute distress HEENT:  Normocephalic, atraumatic, oropharynx patent. EOMI, PERRLA, nontender sinuses, TM normal, no exudates.  Fundo exam normal Cardiovascular:  Regular rate and rhythm, no rubs murmurs or gallops.  No Carotid bruits, radial pulse intact. No pedal edema.  Respiratory: Clear to auscultation bilaterally.  No wheezes, rales, or rhonchi.  No  cyanosis, no use of accessory musculature GI: No organomegaly, abdomen is soft and non-tender, positive bowel sounds.  No masses. Skin: No rashes. Neurologic: Facial musculature symmetric. Neg microfilament exam Psychiatric: Patient is appropriate throughout our interaction. Lymphatic: No cervical lymphadenopathy Musculoskeletal: Gait intact. 5/5 str, 2/2 DTRs, neg Homans   LABS: Results for orders placed in visit on 08/23/13  MICROALBUMIN, URINE      Result Value Ref Range   Microalb, Ur 0.50  0.00 - 1.89 mg/dL  NICOTINE/COTININE METABOLITES      Result Value Ref Range   Cotinine <10    POCT URINALYSIS DIPSTICK      Result Value Ref Range   Color, UA yellow     Clarity, UA clear     Glucose, UA neg     Bilirubin, UA neg     Ketones, UA neg     Spec Grav, UA 1.010     Blood, UA neg     pH, UA 6.0     Protein, UA neg     Urobilinogen, UA 0.2     Nitrite, UA neg     Leukocytes, UA Negative    POCT UA - MICROSCOPIC ONLY      Result Value Ref Range   WBC, Ur, HPF, POC 0-1     RBC, urine, microscopic 0-1     Bacteria, U Microscopic trace     Mucus, UA pos     Epithelial cells, urine per micros neg     Crystals, Ur, HPF, POC neg     Casts, Ur, LPF, POC neg     Yeast, UA neg    POCT CBC      Result Value Ref Range   WBC 7.1  4.6 - 10.2 K/uL   Lymph, poc 2.1  0.6 - 3.4   POC LYMPH PERCENT 29.2  10 - 50 %L   MID (cbc) 0.5  0 - 0.9   POC MID % 6.8  0 - 12 %M   POC Granulocyte 4.5  2 - 6.9   Granulocyte percent 64.0  37 - 80 %G   RBC 5.37  4.69 - 6.13 M/uL   Hemoglobin 15.9  14.1 - 18.1 g/dL   HCT, POC 97.4  16.3 - 53.7 %   MCV 90.3  80 - 97 fL   MCH, POC 29.6  27 - 31.2 pg   MCHC 32.8  31.8 - 35.4 g/dL   RDW, POC 84.5     Platelet Count, POC 208  142 - 424 K/uL   MPV 9.1  0 - 99.8 fL  IFOBT (OCCULT BLOOD)      Result Value Ref Range   IFOBT Negative       EKG/XRAY:   Primary  [facility]   ASSESSMENT/PLAN: Encounter Diagnoses  Name  Primary?  . Lower respiratory infection Yes  . Cough   . Unspecified hypothyroidism   . Diabetes   . Other and unspecified hyperlipidemia    He will return for fasting labs He was given precautions for PE/DVT or worsening sxs, dcline CXR today Rx Azithromycin and tessalon perles for most likley bronchitis sxs vs allergies Refileld meds for chronic meds, advise to return for fasting labs.  F/u in 3 months   Gross sideeffects, risk and benefits, and alternatives of medications d/w patient. Patient is aware that all medications have potential sideeffects and we are unable to predict every sideeffect or drug-drug interaction that may occur.  Hamilton CapriLE, THAO PHUONG, DO 04/21/2014 6:46 AM

## 2014-04-21 ENCOUNTER — Encounter: Payer: Self-pay | Admitting: Family Medicine

## 2014-05-27 ENCOUNTER — Telehealth: Payer: Self-pay

## 2014-05-27 NOTE — Telephone Encounter (Signed)
Patient requesting refill on his blood pressure medication "lisinopril (PRINIVIL,ZESTRIL) 20 MG tablet". [facility] [address] Patients call back number is [phone]

## 2014-05-28 MED ORDER — LISINOPRIL 20 MG PO TABS: 20.0000 mg | ORAL_TABLET | Freq: Every day | ORAL | Status: DC

## 2014-05-28 NOTE — Telephone Encounter (Signed)
Notified pt RF sent in and reminded him to f/up as planned.

## 2014-06-06 ENCOUNTER — Other Ambulatory Visit (INDEPENDENT_AMBULATORY_CARE_PROVIDER_SITE_OTHER): Payer: 59

## 2014-06-06 DIAGNOSIS — E119 Type 2 diabetes mellitus without complications: Secondary | ICD-10-CM

## 2014-06-06 DIAGNOSIS — E785 Hyperlipidemia, unspecified: Secondary | ICD-10-CM

## 2014-06-06 DIAGNOSIS — E039 Hypothyroidism, unspecified: Secondary | ICD-10-CM

## 2014-06-06 LAB — COMPREHENSIVE METABOLIC PANEL
ALT: 24 U/L (ref 0–53)
AST: 17 U/L (ref 0–37)
Albumin: 4.5 g/dL (ref 3.5–5.2)
BUN: 13 mg/dL (ref 6–23)
CO2: 28 mEq/L (ref 19–32)
Calcium: 9.5 mg/dL (ref 8.4–10.5)
Chloride: 104 mEq/L (ref 96–112)
Creat: 0.97 mg/dL (ref 0.50–1.35)
Glucose, Bld: 113 mg/dL — ABNORMAL HIGH (ref 70–99)
Potassium: 4.5 mEq/L (ref 3.5–5.3)
Total Bilirubin: 0.7 mg/dL (ref 0.2–1.2)

## 2014-06-06 LAB — LIPID PANEL
Cholesterol: 150 mg/dL (ref 0–200)
HDL: 49 mg/dL (ref >39)
LDL Cholesterol: 69 mg/dL (ref 0–99)
Total CHOL/HDL Ratio: 3.1 Ratio
Triglycerides: 160 mg/dL — ABNORMAL HIGH (ref <150)
VLDL: 32 mg/dL (ref 0–40)

## 2014-06-06 LAB — COMPREHENSIVE METABOLIC PANEL WITH GFR
Alkaline Phosphatase: 52 U/L (ref 39–117)
Sodium: 139 meq/L (ref 135–145)
Total Protein: 7.2 g/dL (ref 6.0–8.3)

## 2014-06-06 LAB — TSH: TSH: 10.386 u[IU]/mL — ABNORMAL HIGH (ref 0.350–4.500)

## 2014-06-06 NOTE — Progress Notes (Signed)
Pt here for labs only. 

## 2014-06-06 NOTE — Addendum Note (Signed)
Addended by: Elease EtienneHANSEN, SARA A on: 06/06/2014 12:03 PM   Modules accepted: Orders

## 2014-06-07 ENCOUNTER — Telehealth: Payer: Self-pay | Admitting: Family Medicine

## 2014-06-07 DIAGNOSIS — E039 Hypothyroidism, unspecified: Secondary | ICD-10-CM

## 2014-06-07 NOTE — Telephone Encounter (Signed)
Spoke to patient about labs, will recheck TSH in 2 months since he had normal levels in the past. He is compliant and taking thyroid meds correctly. Labs only in 2 months

## 2014-06-10 ENCOUNTER — Encounter: Payer: Self-pay | Admitting: Family Medicine

## 2014-06-11 ENCOUNTER — Other Ambulatory Visit: Payer: Self-pay | Admitting: Family Medicine

## 2014-06-11 DIAGNOSIS — E039 Hypothyroidism, unspecified: Secondary | ICD-10-CM

## 2014-06-11 MED ORDER — LEVOTHYROXINE SODIUM 100 MCG PO TABS: 100.0000 ug | ORAL_TABLET | Freq: Every day | ORAL | Status: DC

## 2014-06-12 ENCOUNTER — Other Ambulatory Visit: Payer: Self-pay | Admitting: Family Medicine

## 2014-06-12 DIAGNOSIS — E039 Hypothyroidism, unspecified: Secondary | ICD-10-CM

## 2014-06-12 MED ORDER — LEVOTHYROXINE SODIUM 100 MCG PO TABS: 100.0000 ug | ORAL_TABLET | Freq: Every day | ORAL | Status: DC

## 2014-08-18 ENCOUNTER — Other Ambulatory Visit: Payer: Self-pay | Admitting: Family Medicine

## 2014-08-28 ENCOUNTER — Other Ambulatory Visit: Payer: Self-pay | Admitting: Family Medicine

## 2014-09-02 ENCOUNTER — Ambulatory Visit (INDEPENDENT_AMBULATORY_CARE_PROVIDER_SITE_OTHER): Payer: 59 | Admitting: Internal Medicine

## 2014-09-02 VITALS — BP 110/86 | HR 76 | Temp 98.2°F | Resp 16 | Ht 64.0 in | Wt 157.6 lb

## 2014-09-02 DIAGNOSIS — Z79899 Other long term (current) drug therapy: Secondary | ICD-10-CM

## 2014-09-02 DIAGNOSIS — Z23 Encounter for immunization: Secondary | ICD-10-CM

## 2014-09-02 DIAGNOSIS — Z0000 Encounter for general adult medical examination without abnormal findings: Secondary | ICD-10-CM

## 2014-09-02 DIAGNOSIS — E039 Hypothyroidism, unspecified: Secondary | ICD-10-CM

## 2014-09-02 LAB — COMPREHENSIVE METABOLIC PANEL
ALT: 21 U/L (ref 0–53)
AST: 19 U/L (ref 0–37)
Albumin: 4.9 g/dL (ref 3.5–5.2)
Alkaline Phosphatase: 59 U/L (ref 39–117)
BILIRUBIN TOTAL: 0.7 mg/dL (ref 0.2–1.2)
BUN: 10 mg/dL (ref 6–23)
CO2: 30 meq/L (ref 19–32)
CREATININE: 1.01 mg/dL (ref 0.50–1.35)
Calcium: 10 mg/dL (ref 8.4–10.5)
Chloride: 101 mEq/L (ref 96–112)
Glucose, Bld: 109 mg/dL — ABNORMAL HIGH (ref 70–99)
Potassium: 4.2 mEq/L (ref 3.5–5.3)
SODIUM: 140 meq/L (ref 135–145)
Total Protein: 7.8 g/dL (ref 6.0–8.3)

## 2014-09-02 LAB — IFOBT (OCCULT BLOOD): IMMUNOLOGICAL FECAL OCCULT BLOOD TEST: NEGATIVE

## 2014-09-02 LAB — POCT URINALYSIS DIPSTICK
Bilirubin, UA: NEGATIVE
Glucose, UA: NEGATIVE
KETONES UA: NEGATIVE
Leukocytes, UA: NEGATIVE
Nitrite, UA: NEGATIVE
PH UA: 5.5
PROTEIN UA: NEGATIVE
RBC UA: NEGATIVE
Spec Grav, UA: 1.02
UROBILINOGEN UA: 0.2

## 2014-09-02 LAB — POCT CBC
GRANULOCYTE PERCENT: 76.6 % (ref 37–80)
HEMATOCRIT: 48.4 % (ref 43.5–53.7)
HEMOGLOBIN: 15.9 g/dL (ref 14.1–18.1)
Lymph, poc: 1.7 (ref 0.6–3.4)
MCH, POC: 28.6 pg (ref 27–31.2)
MCHC: 32.9 g/dL (ref 31.8–35.4)
MCV: 87 fL (ref 80–97)
MID (cbc): 0.6 (ref 0–0.9)
MPV: 6.4 fL (ref 0–99.8)
POC Granulocyte: 7.5 — AB (ref 2–6.9)
POC LYMPH %: 16.9 % (ref 10–50)
POC MID %: 6.5 %M (ref 0–12)
Platelet Count, POC: 293 10*3/uL (ref 142–424)
RBC: 5.56 M/uL (ref 4.69–6.13)
RDW, POC: 12.7 %
WBC: 9.8 10*3/uL (ref 4.6–10.2)

## 2014-09-02 LAB — LIPID PANEL
CHOL/HDL RATIO: 3.3 ratio
Cholesterol: 154 mg/dL (ref 0–200)
HDL: 46 mg/dL (ref >39)
LDL Cholesterol: 76 mg/dL (ref 0–99)
TRIGLYCERIDES: 161 mg/dL — AB (ref <150)
VLDL: 32 mg/dL (ref 0–40)

## 2014-09-02 LAB — POCT UA - MICROSCOPIC ONLY
Bacteria, U Microscopic: NEGATIVE
CASTS, UR, LPF, POC: NEGATIVE
Crystals, Ur, HPF, POC: NEGATIVE
EPITHELIAL CELLS, URINE PER MICROSCOPY: NEGATIVE
MUCUS UA: NEGATIVE
RBC, urine, microscopic: NEGATIVE
WBC, Ur, HPF, POC: NEGATIVE
Yeast, UA: NEGATIVE

## 2014-09-02 LAB — POCT GLYCOSYLATED HEMOGLOBIN (HGB A1C): Hemoglobin A1C: 5.6

## 2014-09-02 LAB — TSH: TSH: 1.354 u[IU]/mL (ref 0.350–4.500)

## 2014-09-02 LAB — GLUCOSE, POCT (MANUAL RESULT ENTRY): POC Glucose: 103 mg/dl — AB (ref 70–99)

## 2014-09-02 NOTE — Patient Instructions (Signed)
DASH Eating Plan DASH stands for "Dietary Approaches to Stop Hypertension." The DASH eating plan is a healthy eating plan that has been shown to reduce high blood pressure (hypertension). Additional health benefits may include reducing the risk of type 2 diabetes mellitus, heart disease, and stroke. The DASH eating plan may also help with weight loss. WHAT DO I NEED TO KNOW ABOUT THE DASH EATING PLAN? For the DASH eating plan, you will follow these general guidelines:  Choose foods with a percent daily value for sodium of less than 5% (as listed on the food label).  Use salt-free seasonings or herbs instead of table salt or sea salt.  Check with your health care provider or pharmacist before using salt substitutes.  Eat lower-sodium products, often labeled as "lower sodium" or "no salt added."  Eat fresh foods.  Eat more vegetables, fruits, and low-fat dairy products.  Choose whole grains. Look for the word "whole" as the first word in the ingredient list.  Choose fish and skinless chicken or [place] more often than red meat. Limit fish, poultry, and meat to 6 oz (170 g) each day.  Limit sweets, desserts, sugars, and sugary drinks.  Choose heart-healthy fats.  Limit cheese to 1 oz (28 g) per day.  Eat more home-cooked food and less restaurant, buffet, and fast food.  Limit fried foods.  Cook foods using methods other than frying.  Limit canned vegetables. If you do use them, rinse them well to decrease the sodium.  When eating at a restaurant, ask that your food be prepared with less salt, or no salt if possible. WHAT FOODS CAN I EAT? Seek help from a dietitian for individual calorie needs. Grains Whole grain or whole wheat bread. Brown rice. Whole grain or whole wheat pasta. Quinoa, bulgur, and whole grain cereals. Low-sodium cereals. Corn or whole wheat flour tortillas. Whole grain cornbread. Whole grain crackers. Low-sodium crackers. Vegetables Fresh or frozen vegetables  (raw, steamed, roasted, or grilled). Low-sodium or reduced-sodium tomato and vegetable juices. Low-sodium or reduced-sodium tomato sauce and paste. Low-sodium or reduced-sodium canned vegetables.  Fruits All fresh, canned (in natural juice), or frozen fruits. Meat and Other Protein Products Ground beef (85% or leaner), grass-fed beef, or beef trimmed of fat. Skinless chicken or [place]. Ground chicken or [place]. Pork trimmed of fat. All fish and seafood. Eggs. Dried beans, peas, or lentils. Unsalted nuts and seeds. Unsalted canned beans. Dairy Low-fat dairy products, such as skim or 1% milk, 2% or reduced-fat cheeses, low-fat ricotta or cottage cheese, or plain low-fat yogurt. Low-sodium or reduced-sodium cheeses. Fats and Oils Tub margarines without trans fats. Light or reduced-fat mayonnaise and salad dressings (reduced sodium). Avocado. Safflower, olive, or canola oils. Natural peanut or almond butter. Other Unsalted popcorn and pretzels. The items listed above may not be a complete list of recommended foods or beverages. Contact your dietitian for more options. WHAT FOODS ARE NOT RECOMMENDED? Grains White bread. White pasta. White rice. Refined cornbread. Bagels and croissants. Crackers that contain trans fat. Vegetables Creamed or fried vegetables. Vegetables in a cheese sauce. Regular canned vegetables. Regular canned tomato sauce and paste. Regular tomato and vegetable juices. Fruits Dried fruits. Canned fruit in light or heavy syrup. Fruit juice. Meat and Other Protein Products Fatty cuts of meat. Ribs, chicken wings, bacon, sausage, bologna, salami, chitterlings, fatback, hot dogs, bratwurst, and packaged luncheon meats. Salted nuts and seeds. Canned beans with salt. Dairy Whole or 2% milk, cream, half-and-half, and cream cheese. Whole-fat or sweetened yogurt. Full-fat  cheeses or blue cheese. Nondairy creamers and whipped toppings. Processed cheese, cheese spreads, or cheese  curds. Condiments Onion and garlic salt, seasoned salt, table salt, and sea salt. Canned and packaged gravies. Worcestershire sauce. Tartar sauce. Barbecue sauce. Teriyaki sauce. Soy sauce, including reduced sodium. Steak sauce. Fish sauce. Oyster sauce. Cocktail sauce. Horseradish. Ketchup and mustard. Meat flavorings and tenderizers. Bouillon cubes. Hot sauce. Tabasco sauce. Marinades. Taco seasonings. Relishes. Fats and Oils Butter, stick margarine, lard, shortening, ghee, and bacon fat. Coconut, palm kernel, or palm oils. Regular salad dressings. Other Pickles and olives. Salted popcorn and pretzels. The items listed above may not be a complete list of foods and beverages to avoid. Contact your dietitian for more information. WHERE CAN I FIND MORE INFORMATION? National Heart, Lung, and [employer] Institute: travelstabloid.com Document Released: 10/14/2011 Document Revised: 03/11/2014 Document Reviewed: 08/29/2013 [facility] Patient Information 2015 [place], [place]. This information is not intended to replace advice given to you by your health care provider. Make sure you discuss any questions you have with your health care provider. Immunization Schedule, Adult  Influenza vaccine.  All adults should be immunized every year.  All adults, including pregnant women and people with hives-only allergy to eggs can receive the inactivated influenza (IIV) vaccine.  Adults aged 18-49 years can receive the recombinant influenza (RIV) vaccine. The RIV vaccine does not contain any egg protein.  Adults aged 66 years or older can receive the standard-dose IIV or the high-dose IIV.  Tetanus, diphtheria, and acellular pertussis (Td, Tdap) vaccine.  Pregnant women should receive 1 dose of Tdap vaccine during each pregnancy. The dose should be obtained regardless of the length of time since the last dose. Immunization is preferred during the 27th to 36th week of gestation.  An  adult who has not previously received Tdap or who does not know his or her vaccine status should receive 1 dose of Tdap. This initial dose should be followed by tetanus and diphtheria toxoids (Td) booster doses every 10 years.  Adults with an unknown or incomplete history of completing a 3-dose immunization series with Td-containing vaccines should begin or complete a primary immunization series including a Tdap dose.  Adults should receive a Td booster every 10 years.  Varicella vaccine.  An adult without evidence of immunity to varicella should receive 2 doses or a second dose if he or she has previously received 1 dose.  Pregnant females who do not have evidence of immunity should receive the first dose after pregnancy. This first dose should be obtained before leaving the health care facility. The second dose should be obtained 4-8 weeks after the first dose.  Human papillomavirus (HPV) vaccine.  Females aged 13-26 years who have not received the vaccine previously should obtain the 3-dose series.  The vaccine is not recommended for use in pregnant females. However, pregnancy testing is not needed before receiving a dose. If a male is found to be pregnant after receiving a dose, no treatment is needed. In that case, the remaining doses should be delayed until after the pregnancy.  Males aged 22-21 years who have not received the vaccine previously should receive the 3-dose series. Males aged 22-26 years may be immunized.  Immunization is recommended through the age of 75 years for any male who has sex with males and did not get any or all doses earlier.  Immunization is recommended for any person with an immunocompromised condition through the age of 30 years if he or she did not get any or  all doses earlier.  During the 3-dose series, the second dose should be obtained 4-8 weeks after the first dose. The third dose should be obtained 24 weeks after the first dose and 16 weeks after the  second dose.  Zoster vaccine.  One dose is recommended for adults aged 60 years or older unless certain conditions are present.  Measles, mumps, and rubella (MMR) vaccine.  Adults born before 1957 generally are considered immune to measles and mumps.  Adults born in 1957 or later should have 1 or more doses of MMR vaccine unless there is a contraindication to the vaccine or there is laboratory evidence of immunity to each of the three diseases.  A routine second dose of MMR vaccine should be obtained at least 28 days after the first dose for students attending postsecondary schools, health care workers, or international travelers.  People who received inactivated measles vaccine or an unknown type of measles vaccine during 1963-1967 should receive 2 doses of MMR vaccine.  People who received inactivated mumps vaccine or an unknown type of mumps vaccine before 1979 and are at high risk for mumps infection should consider immunization with 2 doses of MMR vaccine.  For females of childbearing age, rubella immunity should be determined. If there is no evidence of immunity, females who are not pregnant should be vaccinated. If there is no evidence of immunity, females who are pregnant should delay immunization until after pregnancy.  Unvaccinated health care workers born before 1957 who lack laboratory evidence of measles, mumps, or rubella immunity or laboratory confirmation of disease should consider measles and mumps immunization with 2 doses of MMR vaccine or rubella immunization with 1 dose of MMR vaccine.  Pneumococcal 13-valent conjugate (PCV13) vaccine.  When indicated, a person who is uncertain of his or her immunization history and has no record of immunization should receive the PCV13 vaccine.  An adult aged 19 years or older who has certain medical conditions and has not been previously immunized should receive 1 dose of PCV13 vaccine. This PCV13 should be followed with a dose of  pneumococcal polysaccharide (PPSV23) vaccine. The PPSV23 vaccine dose should be obtained at least 8 weeks after the dose of PCV13 vaccine.  An adult aged 19 years or older who has certain medical conditions and previously received 1 or more doses of PPSV23 vaccine should receive 1 dose of PCV13. The PCV13 vaccine dose should be obtained 1 or more years after the last PPSV23 vaccine dose.  Pneumococcal polysaccharide (PPSV23) vaccine.  When PCV13 is also indicated, PCV13 should be obtained first.  All adults aged 65 years and older should be immunized.  An adult younger than age 65 years who has certain medical conditions should be immunized.  Any person who resides in a nursing home or long-term care facility should be immunized.  An adult smoker should be immunized.  People with an immunocompromised condition and certain other conditions should receive both PCV13 and PPSV23 vaccines.  People with human immunodeficiency virus (HIV) infection should be immunized as soon as possible after diagnosis.  Immunization during chemotherapy or radiation therapy should be avoided.  Routine use of PPSV23 vaccine is not recommended for American Indians, [place] Natives, or people younger than 65 years unless there are medical conditions that require PPSV23 vaccine.  When indicated, people who have unknown immunization and have no record of immunization should receive PPSV23 vaccine.  One-time revaccination 5 years after the first dose of PPSV23 is recommended for people aged 19-64 years who   have chronic kidney failure, nephrotic syndrome, asplenia, or immunocompromised conditions.  People who received 1-2 doses of PPSV23 before age 81 years should receive another dose of PPSV23 vaccine at age 4 years or later if at least 5 years have passed since the previous dose.  Doses of PPSV23 are not needed for people immunized with PPSV23 at or after age 42 years.  Meningococcal vaccine.  Adults with  asplenia or persistent complement component deficiencies should receive 2 doses of quadrivalent meningococcal conjugate (MenACWY-D) vaccine. The doses should be obtained at least 2 months apart.  Microbiologists working with certain meningococcal bacteria, [place] recruits, people at risk during an outbreak, and people who travel to or live in countries with a high rate of meningitis should be immunized.  A first-year college student up through age 50 years who is living in a residence hall should receive a dose if he or she did not receive a dose on or after his or her 16th birthday.  Adults who have certain high-risk conditions should receive one or more doses of vaccine.  Hepatitis A vaccine.  Adults who wish to be protected from this disease, have certain high-risk conditions, work with hepatitis A-infected animals, work in hepatitis A research labs, or travel to or work in countries with a high rate of hepatitis A should be immunized.  Adults who were previously unvaccinated and who anticipate close contact with an international adoptee during the first 60 days after arrival in the [place] from a country with a high rate of hepatitis A should be immunized.  Hepatitis B vaccine.  Adults who wish to be protected from this disease, have certain high-risk conditions, may be exposed to blood or other infectious body fluids, are household contacts or sex partners of hepatitis B positive people, are clients or workers in certain care facilities, or travel to or work in countries with a high rate of hepatitis B should be immunized.  Haemophilus influenzae type b (Hib) vaccine.  A previously unvaccinated person with asplenia or sickle cell disease or having a scheduled splenectomy should receive 1 dose of Hib vaccine.  Regardless of previous immunization, a recipient of a hematopoietic stem cell transplant should receive a 3-dose series 6-12 months after his or her successful  transplant.  Hib vaccine is not recommended for adults with HIV infection. Document Released: 01/15/2004 Document Revised: 02/19/2013 Document Reviewed: 12/12/2012 [facility] Patient Information 2015 [place], [place]. This information is not intended to replace advice given to you by your health care provider. Make sure you discuss any questions you have with your health care provider.

## 2014-09-02 NOTE — Progress Notes (Signed)
Subjective:    Patient ID: Troy Valentine, male    DOB: 1961-08-23, 53 y.o.   MRN: [id number]  HPI Patient is here today for complete physical exam. He has not had anything to eat in the last 8 hours. He states his blood pressure has been well controlled. He states everything else seems to be under control.  Is on metformin once day for mild glucose intolerance. Last A1c 5.5  Patient would like to discuss his synthroid dosage. He was bumped up to 100 mcg at his last visit, but states this higher dose made him "jumpy". He has been alternating between 75 mcg and 100 mcg.   Patient denies any SOB or chest pain recently.   Patient states he is up to date on his preventative care and immunizations, except for his colonoscopy. Patient would like to wait until his insurance changes in January to schedule his colonoscopy.  Fhx of glaucoma  Review of Systems  Constitutional: Negative.   HENT: Negative.   Eyes: Negative.   Respiratory: Negative.   Cardiovascular: Negative.   Gastrointestinal: Negative.   Endocrine: Negative.   Genitourinary: Negative.   Musculoskeletal: Negative.   Skin: Negative.   Allergic/Immunologic: Negative.   Neurological: Negative.   Hematological: Negative.   Psychiatric/Behavioral: Negative.        Objective:   Physical Exam  Vitals reviewed. Constitutional: He is oriented to person, place, and time. He appears well-developed and well-nourished.  HENT:  Head: Normocephalic and atraumatic.  Right Ear: External ear normal.  Left Ear: External ear normal.  Nose: Nose normal.  Mouth/Throat: Oropharynx is clear and moist.  Eyes: Conjunctivae and EOM are normal. Pupils are equal, round, and reactive to light.  Neck: Normal range of motion. Neck supple. No tracheal deviation present. No thyromegaly present.  Cardiovascular: Normal rate, regular rhythm, normal heart sounds and intact distal pulses.   Pulmonary/Chest: Effort normal and breath sounds  normal.  Abdominal: Soft. Bowel sounds are normal.  Genitourinary: Rectum normal, prostate normal and penis normal.  Musculoskeletal: Normal range of motion.  Lymphadenopathy:    He has no cervical adenopathy.  Neurological: He is alert and oriented to person, place, and time. He has normal reflexes.  Psychiatric: He has a normal mood and affect. His behavior is normal. Judgment and thought content normal.    Results for orders placed in visit on 06/06/14  TSH      Result Value Ref Range   TSH 10.386 (*) 0.350 - 4.500 uIU/mL  COMPREHENSIVE METABOLIC PANEL      Result Value Ref Range   Sodium 139  135 - 145 mEq/L   Potassium 4.5  3.5 - 5.3 mEq/L   Chloride 104  96 - 112 mEq/L   CO2 28  19 - 32 mEq/L   Glucose, Bld 113 (*) 70 - 99 mg/dL   BUN 13  6 - 23 mg/dL   Creat 4.090.97  8.110.50 - 9.141.35 mg/dL   Total Bilirubin 0.7  0.2 - 1.2 mg/dL   Alkaline Phosphatase 52  39 - 117 U/L   AST 17  0 - 37 U/L   ALT 24  0 - 53 U/L   Total Protein 7.2  6.0 - 8.3 g/dL   Albumin 4.5  3.5 - 5.2 g/dL   Calcium 9.5  8.4 - 78.210.5 mg/dL  LIPID PANEL      Result Value Ref Range   Cholesterol 150  0 - 200 mg/dL   Triglycerides 956160 (*) <150 mg/dL  HDL 49  >39 mg/dL   Total CHOL/HDL Ratio 3.1     VLDL 32  0 - 40 mg/dL   LDL Cholesterol 69  0 - 99 mg/dL   EKG normal  Results for orders placed in visit on 09/02/14  POCT CBC      Result Value Ref Range   WBC 9.8  4.6 - 10.2 K/uL   Lymph, poc 1.7  0.6 - 3.4   POC LYMPH PERCENT 16.9  10 - 50 %L   MID (cbc) 0.6  0 - 0.9   POC MID % 6.5  0 - 12 %M   POC Granulocyte 7.5 (*) 2 - 6.9   Granulocyte percent 76.6  37 - 80 %G   RBC 5.56  4.69 - 6.13 M/uL   Hemoglobin 15.9  14.1 - 18.1 g/dL   HCT, POC 54.048.4  98.143.5 - 53.7 %   MCV 87.0  80 - 97 fL   MCH, POC 28.6  27 - 31.2 pg   MCHC 32.9  31.8 - 35.4 g/dL   RDW, POC 19.112.7     Platelet Count, POC 293  142 - 424 K/uL   MPV 6.4  0 - 99.8 fL  GLUCOSE, POCT (MANUAL RESULT ENTRY)      Result Value Ref Range   POC  Glucose 103 (*) 70 - 99 mg/dl  IFOBT (OCCULT BLOOD)      Result Value Ref Range   IFOBT Negative    POCT GLYCOSYLATED HEMOGLOBIN (HGB A1C)      Result Value Ref Range   Hemoglobin A1C 5.6    POCT URINALYSIS DIPSTICK      Result Value Ref Range   Color, UA yellow     Clarity, UA clear     Glucose, UA neg     Bilirubin, UA neg     Ketones, UA neg     Spec Grav, UA 1.020     Blood, UA neg     pH, UA 5.5     Protein, UA neg     Urobilinogen, UA 0.2     Nitrite, UA neg     Leukocytes, UA Negative    POCT UA - MICROSCOPIC ONLY      Result Value Ref Range   WBC, Ur, HPF, POC neg     RBC, urine, microscopic neg     Bacteria, U Microscopic neg     Mucus, UA neg     Epithelial cells, urine per micros neg     Crystals, Ur, HPF, POC neg     Casts, Ur, LPF, POC neg     Yeast, UA neg          Assessment & Plan:  Healthy exam

## 2014-09-03 LAB — PSA: PSA: 1.21 ng/mL (ref <=4.00)

## 2014-09-03 LAB — NICOTINE/COTININE METABOLITES

## 2014-09-04 ENCOUNTER — Telehealth: Payer: Self-pay | Admitting: *Deleted

## 2014-09-04 NOTE — Telephone Encounter (Signed)
Returned call to pt in regards to normal lab results. Left copy up front for pick up .

## 2014-09-12 ENCOUNTER — Telehealth: Payer: Self-pay | Admitting: *Deleted

## 2014-09-12 NOTE — Telephone Encounter (Signed)
Wellness screening form has been completed and faxed.  Copy sent to scan.  Original sent to pt with fax confirmation.

## 2014-09-21 ENCOUNTER — Other Ambulatory Visit: Payer: Self-pay | Admitting: Family Medicine

## 2014-09-27 ENCOUNTER — Ambulatory Visit (INDEPENDENT_AMBULATORY_CARE_PROVIDER_SITE_OTHER): Payer: 59 | Admitting: Family Medicine

## 2014-09-27 VITALS — BP 130/84 | HR 65 | Temp 98.2°F | Resp 16 | Ht 64.0 in | Wt 160.0 lb

## 2014-09-27 DIAGNOSIS — L309 Dermatitis, unspecified: Secondary | ICD-10-CM

## 2014-09-27 DIAGNOSIS — K219 Gastro-esophageal reflux disease without esophagitis: Secondary | ICD-10-CM

## 2014-09-27 DIAGNOSIS — E038 Other specified hypothyroidism: Secondary | ICD-10-CM

## 2014-09-27 DIAGNOSIS — J200 Acute bronchitis due to Mycoplasma pneumoniae: Secondary | ICD-10-CM

## 2014-09-27 MED ORDER — LEVOTHYROXINE SODIUM 88 MCG PO TABS: ORAL_TABLET | ORAL | Status: DC

## 2014-09-27 MED ORDER — AZITHROMYCIN 250 MG PO TABS: ORAL_TABLET | ORAL | Status: DC

## 2014-09-27 MED ORDER — HYDROCODONE-HOMATROPINE 5-1.5 MG/5ML PO SYRP: 5.0000 mL | ORAL_SOLUTION | Freq: Three times a day (TID) | ORAL | Status: DC | PRN

## 2014-09-27 MED ORDER — DESOXIMETASONE 0.25 % EX CREA: TOPICAL_CREAM | Freq: Two times a day (BID) | CUTANEOUS | Status: DC

## 2014-09-27 MED ORDER — BENZONATATE 100 MG PO CAPS: 100.0000 mg | ORAL_CAPSULE | Freq: Three times a day (TID) | ORAL | Status: DC | PRN

## 2014-09-27 MED ORDER — OMEPRAZOLE 40 MG PO CPDR: 40.0000 mg | DELAYED_RELEASE_CAPSULE | Freq: Every day | ORAL | Status: DC

## 2014-09-27 NOTE — Progress Notes (Signed)
[facility] [address], [place] [id number] [phone]  Date:  09/27/2014   Name:  Troy CaldwellRajeshkumar Gillen   DOB:  [birth year]   MRN:  [id number]  PCP:  Abbe AmsterdamOPLAND,Raheel Kunkle, MD    Chief Complaint: Cough   History of Present Illness:  Troy Valentine is a 53 y.o. very pleasant male patient who presents with the following:  He was here last month and noted a bit of a cough, he is not too bothered by it but it has not gone away.  His sx will wax and wane.  He has felt feverish but not run a temperature when he checked his temp. He has noted a ST and tender cervical nodes.    The cough is generally dry- he rarely coughs up any material  He has used some robitussin, claritin, throat drops. He will cough more if he talks or laughs.  He has never been a smoker His recent TSH was normal.  However he is alternating from 100 and 75 mcg every other day.  He would like to have some 88 mcg pills if possible to split the difference Patient Active Problem List   Diagnosis Date Noted  . Hyperlipidemia LDL goal < 100 09/13/2012  . Hypertension 09/13/2012  . Hypothyroid 09/13/2012  . Diabetes mellitus 09/13/2012  . Encounter for long-term (current) use of other medications 09/13/2012    Past Medical History  Diagnosis Date  . Hypertension   . Allergy   . Diabetes mellitus without complication   . Heart murmur   . Thyroid disease     Past Surgical History  Procedure Laterality Date  . Coarctation of aorta excision      History  Substance Use Topics  . Smoking status: Never Smoker   . Smokeless tobacco: Not on file  . Alcohol Use: No    Family History  Problem Relation Age of Onset  . Hypertension Mother     No Known Allergies  Medication list has been reviewed and updated.  Current Outpatient Prescriptions on File Prior to Visit  Medication Sig Dispense Refill  . aspirin 81 MG tablet Take 81 mg by mouth daily.    Marland Kitchen. desoximetasone (TOPICORT) 0.25 %  cream Apply topically 2 (two) times daily. 100 g 2  . levothyroxine (SYNTHROID, LEVOTHROID) 100 MCG tablet TAKE 1 TABLET DAILY  NEEDS F/U FOR ADDITIONAL REFILLS 30 tablet 0  . lisinopril (PRINIVIL,ZESTRIL) 20 MG tablet TAKE 1 TABLET BY MOUTH ONCE A DAY 90 tablet 0  . metFORMIN (GLUCOPHAGE-XR) 500 MG 24 hr tablet TAKE 1 TABLET DAILY 90 tablet 1  . omeprazole (PRILOSEC) 40 MG capsule Take 1 capsule (40 mg total) by mouth daily. 30 capsule 3  . simvastatin (ZOCOR) 40 MG tablet TAKE 1 TABLET EVERY EVENING 90 tablet 1   No current facility-administered medications on file prior to visit.    Review of Systems:  As per HPI- otherwise negative.   Physical Examination: Filed Vitals:   09/27/14 1540  BP: 130/84  Pulse: 100  Temp: 98.2 F (36.8 C)  Resp: 16   Filed Vitals:   09/27/14 1540  Height: 5\' 4"  (1.626 m)  Weight: 160 lb (72.576 kg)   Body mass index is 27.45 kg/(m^2). Ideal Body Weight: Weight in (lb) to have BMI = 25: 145.3  GEN: WDWN, NAD, Non-toxic, A & O x 3, looks well HEENT: Atraumatic, Normocephalic. Neck supple. No masses, No LAD.  Bilateral TM wnl, oropharynx normal.  PEERL,EOMI.  Ears and Nose: No external deformity. CV: RRR, No M/G/R. No JVD. No thrill. No extra heart sounds. PULM: CTA B, no wheezes, crackles, rhonchi. No retractions. No resp. distress. No accessory muscle use. EXTR: No c/c/e NEURO Normal gait.  PSYCH: Normally interactive. Conversant. Not depressed or anxious appearing.  Calm demeanor.    Assessment and Plan: Acute bronchitis due to Mycoplasma pneumoniae - Plan: azithromycin (ZITHROMAX) 250 MG tablet, benzonatate (TESSALON) 100 MG capsule, HYDROcodone-homatropine (HYCODAN) 5-1.5 MG/5ML syrup, DISCONTINUED: HYDROcodone-homatropine (HYCODAN) 5-1.5 MG/5ML syrup  Other specified hypothyroidism - Plan: levothyroxine (SYNTHROID, LEVOTHROID) 88 MCG tablet  Gastroesophageal reflux disease without esophagitis - Plan: omeprazole (PRILOSEC) 40 MG  capsule  Eczema - Plan: desoximetasone (TOPICORT) 0.25 % cream  Persistent cough.  Treat with azithromycin, hycodan, tessalon.  He will let me know if not better Refilled her other medications as above.  He has an open order for a TSH; asked him to do this in 4-6 months   Signed Abbe AmsterdamJessica Mazal Ebey, MD

## 2014-09-27 NOTE — Patient Instructions (Signed)
Let me know if you are not better in the next couple of weeks. Use the cough syrup as needed but do not take it when you need to drive I sent your chronic meds to the mail order pharm for you

## 2015-02-04 ENCOUNTER — Other Ambulatory Visit: Payer: Self-pay | Admitting: Physician Assistant

## 2015-02-06 MED ORDER — LISINOPRIL 20 MG PO TABS: 20.0000 mg | ORAL_TABLET | Freq: Every day | ORAL | Status: DC

## 2015-02-28 ENCOUNTER — Other Ambulatory Visit: Payer: Self-pay | Admitting: Internal Medicine

## 2015-06-20 ENCOUNTER — Telehealth: Payer: Self-pay

## 2015-06-20 NOTE — Telephone Encounter (Signed)
Pt is requesting a refill of lisinopril and metformin. He will be out of town until August 29th and needs a prescription to hold him over while he's gone. He plans on scheduling an appt when [place] to the country.

## 2015-06-22 ENCOUNTER — Other Ambulatory Visit: Payer: Self-pay | Admitting: Family Medicine

## 2015-06-22 ENCOUNTER — Other Ambulatory Visit: Payer: Self-pay | Admitting: Internal Medicine

## 2015-06-23 MED ORDER — METFORMIN HCL ER 500 MG PO TB24: 500.0000 mg | ORAL_TABLET | Freq: Every day | ORAL | Status: DC

## 2015-06-23 MED ORDER — LISINOPRIL 20 MG PO TABS: 20.0000 mg | ORAL_TABLET | Freq: Every day | ORAL | Status: DC

## 2015-07-11 ENCOUNTER — Ambulatory Visit (INDEPENDENT_AMBULATORY_CARE_PROVIDER_SITE_OTHER): Payer: BLUE CROSS/BLUE SHIELD | Admitting: Physician Assistant

## 2015-07-11 VITALS — BP 114/80 | HR 77 | Temp 98.2°F | Resp 16 | Ht 64.0 in | Wt 158.0 lb

## 2015-07-11 DIAGNOSIS — E039 Hypothyroidism, unspecified: Secondary | ICD-10-CM | POA: Diagnosis not present

## 2015-07-11 DIAGNOSIS — Z23 Encounter for immunization: Secondary | ICD-10-CM

## 2015-07-11 DIAGNOSIS — E119 Type 2 diabetes mellitus without complications: Secondary | ICD-10-CM

## 2015-07-11 LAB — COMPLETE METABOLIC PANEL WITH GFR
ALT: 27 U/L (ref 9–46)
AST: 19 U/L (ref 10–35)
Albumin: 4.4 g/dL (ref 3.6–5.1)
Alkaline Phosphatase: 51 U/L (ref 40–115)
BUN: 12 mg/dL (ref 7–25)
CHLORIDE: 103 mmol/L (ref 98–110)
CO2: 27 mmol/L (ref 20–31)
CREATININE: 0.81 mg/dL (ref 0.70–1.33)
Calcium: 9.5 mg/dL (ref 8.6–10.3)
GFR, Est African American: >89 mL/min (ref >=60)
GFR, Est Non African American: >89 mL/min (ref >=60)
GLUCOSE: 99 mg/dL (ref 65–99)
Potassium: 4.7 mmol/L (ref 3.5–5.3)
SODIUM: 143 mmol/L (ref 135–146)
Total Bilirubin: 0.8 mg/dL (ref 0.2–1.2)
Total Protein: 6.8 g/dL (ref 6.1–8.1)

## 2015-07-11 LAB — LIPID PANEL
CHOL/HDL RATIO: 2.8 ratio (ref <=5.0)
Cholesterol: 143 mg/dL (ref 125–200)
HDL: 51 mg/dL (ref >=40)
LDL CALC: 72 mg/dL (ref <130)
Triglycerides: 100 mg/dL (ref <150)
VLDL: 20 mg/dL (ref <30)

## 2015-07-11 LAB — HEMOGLOBIN A1C
Hgb A1c MFr Bld: 5.9 % — ABNORMAL HIGH (ref <5.7)
MEAN PLASMA GLUCOSE: 123 mg/dL — AB (ref <117)

## 2015-07-11 LAB — TSH: TSH: 0.759 u[IU]/mL (ref 0.350–4.500)

## 2015-07-11 MED ORDER — LISINOPRIL 20 MG PO TABS: 20.0000 mg | ORAL_TABLET | Freq: Every day | ORAL | Status: DC

## 2015-07-11 MED ORDER — METFORMIN HCL ER 500 MG PO TB24: 500.0000 mg | ORAL_TABLET | Freq: Every day | ORAL | Status: DC

## 2015-07-11 NOTE — Patient Instructions (Signed)
Try to get in aerobic exercise 4 times per week for 30 minutes. Continue to hydrate well (64oz per day, almost 4 regular sized water bottles). Colonoscopy A colonoscopy is an exam to look at your colon. This exam can help find lumps (tumors), growths (polyps), bleeding, and redness and puffiness (inflammation) in your colon.  BEFORE THE PROCEDURE  Ask your doctor about changing or stopping your regular medicines.  You may need to drink a large amount of a special liquid (oral bowel prep). You start drinking this the day before your procedure. It will cause you to have watery poop (stool). This cleans out your colon.  Do not eat or drink anything else once you have started the bowel prep, unless your doctor tells you it is safe to do so.  Make plans for someone to drive you home after the procedure. PROCEDURE  You will be given medicine to help you relax (sedative).  You will lie on your side with your knees bent.  A tube with a camera on the end is put in the opening of your butt (anus) and into your colon. Pictures are sent to a computer screen. Your doctor will look for anything that is not normal.  Your doctor may take a tissue sample (biopsy) from your colon to be looked at more closely.  The exam is finished when your doctor has viewed all of the colon. AFTER THE PROCEDURE  Do not drive for 24 hours after the exam.  You may have a small amount of blood in your poop. This is normal.  You may pass gas and have belly (abdominal) cramps. This is normal.  Ask when your test results will be ready. Make sure you get your test results. Document Released: 11/27/2010 Document Revised: 10/30/2013 Document Reviewed: 07/02/2013 [facility] Patient Information 2015 [place], [place]. This information is not intended to replace advice given to you by your health care provider. Make sure you discuss any questions you have with your health care provider.     Diabetes and Exercise Exercising  regularly is important. It is not just about losing weight. It has many health benefits, such as:  Improving your overall fitness, flexibility, and endurance.  Increasing your bone density.  Helping with weight control.  Decreasing your body fat.  Increasing your muscle strength.  Reducing stress and tension.  Improving your overall health. People with diabetes who exercise gain additional benefits because exercise:  Reduces appetite.  Improves the body's use of blood sugar (glucose).  Helps lower or control blood glucose.  Decreases blood pressure.  Helps control blood lipids (such as cholesterol and triglycerides).  Improves the body's use of the hormone insulin by:  Increasing the body's insulin sensitivity.  Reducing the body's insulin needs.  Decreases the risk for heart disease because exercising:  Lowers cholesterol and triglycerides levels.  Increases the levels of good cholesterol (such as high-density lipoproteins [HDL]) in the body.  Lowers blood glucose levels. YOUR ACTIVITY PLAN  Choose an activity that you enjoy and set realistic goals. Your health care provider or diabetes educator can help you make an activity plan that works for you. Exercise regularly as directed by your health care provider. This includes:  Performing resistance training twice a week such as push-ups, sit-ups, lifting weights, or using resistance bands.  Performing 150 minutes of cardio exercises each week such as walking, running, or playing sports.  Staying active and spending no more than 90 minutes at one time being inactive. Even short bursts  of exercise are good for you. Three 10-minute sessions spread throughout the day are just as beneficial as a single 30-minute session. Some exercise ideas include:  Taking the dog for a walk.  Taking the stairs instead of the elevator.  Dancing to your favorite song.  Doing an exercise video. Doing your favorite exercise with a  friend.

## 2015-07-11 NOTE — Progress Notes (Signed)
[facility] [address] [place] [address] [id number]  Date:  07/11/2015   Name:  Troy Valentine   DOB:  May 28, 1961   MRN:  [id number]  PCP:  Abbe Amsterdam, MD    History of Present Illness:  Troy Valentine is a 54 y.o. male patient who present to [facility] for medication refill.    -Patient does not have any concerns or complaints at this time. -He reports no chest pains, palpitations, change in vision, n/t of extremities, polyuria, or n/v. -He is complaint on his hypothyroid, diabetic, and HL medications. -He does not monitor glucose at home, but maintains a well-controlled blood sugar. -Diet is vegetarian.  Hydrates well.  May drink caffeinated coffee. -Excercises 3x per week, and does the 10000 steps.  Not aerobic.  -Very minimal fruit intake -Exercising 3x per week, 10000 steps.    His goal is to be able to come off the metformin.  Opthalmologist 11/2014    Patient Active Problem List   Diagnosis Date Noted  . Hyperlipidemia LDL goal < 100 09/13/2012  . Hypertension 09/13/2012  . Hypothyroid 09/13/2012  . Diabetes mellitus 09/13/2012  . Encounter for long-term (current) use of other medications 09/13/2012    Past Medical History  Diagnosis Date  . Hypertension   . Allergy   . Diabetes mellitus without complication   . Heart murmur   . Thyroid disease     Past Surgical History  Procedure Laterality Date  . Coarctation of aorta excision      Social History  Substance Use Topics  . Smoking status: Never Smoker   . Smokeless tobacco: None  . Alcohol Use: No    Family History  Problem Relation Age of Onset  . Hypertension Mother     No Known Allergies  Medication list has been reviewed and updated.  Current Outpatient Prescriptions on File Prior to Visit  Medication Sig Dispense Refill  . aspirin 81 MG tablet Take 81 mg by mouth daily.    Marland Kitchen desoximetasone (TOPICORT) 0.25 % cream Apply topically 2 (two) times daily.  100 g 2  . levothyroxine (SYNTHROID, LEVOTHROID) 88 MCG tablet TAKE 1 TABLET DAILY  NEEDS F/U FOR ADDITIONAL REFILLS 90 tablet 3  . lisinopril (PRINIVIL,ZESTRIL) 20 MG tablet Take 1 tablet (20 mg total) by mouth daily. NO MORE REFILLS WITHOUT OFFICE VISIT - 2ND NOTICE 20 tablet 0  . metFORMIN (GLUCOPHAGE-XR) 500 MG 24 hr tablet Take 1 tablet (500 mg total) by mouth daily. NO MORE REFILLS WITHOUT OFFICE VISIT - 2ND NOTICE 20 tablet 0  . simvastatin (ZOCOR) 40 MG tablet Take 1 tablet (40 mg total) by mouth every evening. PATIENT NEEDS OFFICE VISIT FOR ADDITIONAL REFILLS 30 tablet 0  . azithromycin (ZITHROMAX) 250 MG tablet Use as a zpack (Patient not taking: Reported on 07/11/2015) 6 tablet 0  . benzonatate (TESSALON) 100 MG capsule Take 1 capsule (100 mg total) by mouth 3 (three) times daily as needed for cough. (Patient not taking: Reported on 07/11/2015) 40 capsule 0  . HYDROcodone-homatropine (HYCODAN) 5-1.5 MG/5ML syrup Take 5 mLs by mouth every 8 (eight) hours as needed for cough. (Patient not taking: Reported on 07/11/2015) 90 mL 0  . omeprazole (PRILOSEC) 40 MG capsule Take 1 capsule (40 mg total) by mouth daily. (Patient not taking: Reported on 07/11/2015) 30 capsule 3   No current facility-administered medications on file prior to visit.    ROS ROS otherwise unremarkable unless listed above.   Physical Examination: BP  114/80 mmHg  Pulse 77  Temp(Src) 98.2 F (36.8 C) (Oral)  Resp 16  Ht  (1.626 m)  Wt 158 lb (71.668 kg)  BMI 27.11 kg/m2  SpO2 99% Ideal Body Weight: Weight in (lb) to have BMI = 25: 145.3  Physical Exam  Constitutional: He is oriented to person, place, and time. He appears well-developed and well-nourished. No distress.  HENT:  Head: Normocephalic and atraumatic.  Right Ear: External ear normal.  Left Ear: External ear normal.  Nose: Nose normal.  Mouth/Throat: Oropharynx is clear and moist. No oropharyngeal exudate.  Eyes: Conjunctivae and EOM are normal.  Pupils are equal, round, and reactive to light. Right eye exhibits no discharge. Left eye exhibits no discharge. No scleral icterus.  Neck: Normal range of motion. No thyromegaly present.  Cardiovascular: Normal rate, regular rhythm, normal heart sounds and intact distal pulses.  Exam reveals no friction rub.   No murmur heard. Pulses:      Dorsalis pedis pulses are 2+ on the right side, and 2+ on the left side.       Posterior tibial pulses are 2+ on the left side.  Pulmonary/Chest: Effort normal and breath sounds normal. No respiratory distress. He has no wheezes.  Musculoskeletal: Normal range of motion. He exhibits no edema or tenderness.  Feet:  Right Foot:  Protective Sensation: 6 sites tested.6 sites sensed. Skin Integrity: Negative for ulcer, blister, skin breakdown, erythema or warmth.  Left Foot:  Protective Sensation: 6 sites tested. 6 sites sensed. Skin Integrity: Negative for ulcer, blister, skin breakdown, erythema or warmth.  Lymphadenopathy:    He has no cervical adenopathy.  Neurological: He is alert and oriented to person, place, and time.  Skin: Skin is warm and dry. No rash noted. He is not diaphoretic.  Psychiatric: He has a normal mood and affect. His behavior is normal.   Results for orders placed or performed in visit on 07/11/15  COMPLETE METABOLIC PANEL WITH GFR  Result Value Ref Range   Sodium 143 135 - 146 mmol/L   Potassium 4.7 3.5 - 5.3 mmol/L   Chloride 103 98 - 110 mmol/L   CO2 27 20 - 31 mmol/L   Glucose, Bld 99 65 - 99 mg/dL   BUN 12 7 - 25 mg/dL   Creat 1.61 0.96 - 0.45 mg/dL   Total Bilirubin 0.8 0.2 - 1.2 mg/dL   Alkaline Phosphatase 51 40 - 115 U/L   AST 19 10 - 35 U/L   ALT 27 9 - 46 U/L   Total Protein 6.8 6.1 - 8.1 g/dL   Albumin 4.4 3.6 - 5.1 g/dL   Calcium 9.5 8.6 - 40.9 mg/dL   GFR, Est African American >89 >=60 mL/min   GFR, Est Non African American >89 >=60 mL/min  TSH  Result Value Ref Range   TSH 0.759 0.350 - 4.500 uIU/mL   Lipid panel  Result Value Ref Range   Cholesterol 143 125 - 200 mg/dL   Triglycerides 811 <914 mg/dL   HDL 51 >=78 mg/dL   Total CHOL/HDL Ratio 2.8 <=5.0 Ratio   VLDL 20 <30 mg/dL   LDL Cholesterol 72 <295 mg/dL  Hemoglobin A2Z  Result Value Ref Range   Hgb A1c MFr Bld 5.9 (H) <5.7 %   Mean Plasma Glucose 123 (H) <117 mg/dL     Assessment and Plan: 54 year old male with PMH of DM2, HL, and Hypothyroid is here today for medication refill. -a1c is trending up over the  last 2 years.  I have advised him to be sensitive to food, carbs, and starches at this time.  If this continues to trend up over the 3-6 months, may increase his metformin at that time.    Discussed verbally of aerobic exercise 4 times per week of at least 30 minutes.     -Cholesterol is within normal limits at this time.  No change of statin therapy -Flu vaccine given at this visit.  -Discussed need for colonoscopy.  He will make appt., but declines referral at this time.   -TSH within normal range.  No change in dose. -Rtn in 3-6 months for a1c recheck.  Type 2 diabetes mellitus without complication - Plan: COMPLETE METABOLIC PANEL WITH GFR, Lipid panel, Hemoglobin A1c, metFORMIN (GLUCOPHAGE-XR) 500 MG 24 hr tablet, lisinopril (PRINIVIL,ZESTRIL) 20 MG tablet  Hypothyroidism, unspecified hypothyroidism type - Plan: TSH  Flu vaccine need - Plan: Flu Vaccine QUAD 36+ mos IM   Trena Platt, PA-C [facility] 07/11/2015 8:35 AM

## 2015-07-14 ENCOUNTER — Other Ambulatory Visit: Payer: Self-pay | Admitting: Physician Assistant

## 2015-07-14 DIAGNOSIS — E038 Other specified hypothyroidism: Secondary | ICD-10-CM

## 2015-07-14 DIAGNOSIS — E119 Type 2 diabetes mellitus without complications: Secondary | ICD-10-CM

## 2015-07-14 MED ORDER — SIMVASTATIN 40 MG PO TABS: 40.0000 mg | ORAL_TABLET | Freq: Every evening | ORAL | Status: DC

## 2015-07-14 MED ORDER — LEVOTHYROXINE SODIUM 88 MCG PO TABS: ORAL_TABLET | ORAL | Status: DC

## 2015-07-14 MED ORDER — METFORMIN HCL ER 500 MG PO TB24: 500.0000 mg | ORAL_TABLET | Freq: Every day | ORAL | Status: DC

## 2015-07-14 MED ORDER — LISINOPRIL 20 MG PO TABS: 20.0000 mg | ORAL_TABLET | Freq: Every day | ORAL | Status: DC

## 2015-07-14 NOTE — Progress Notes (Signed)
Please alert patient that 90 day with 1 refill was sent to mail prescription services of levothyroxine, metformin, simvastatin, and lisinopril.  He will return in 6 months for recheck.  Please let us know if he has any questions.

## 2015-07-15 ENCOUNTER — Encounter: Payer: Self-pay | Admitting: Family Medicine

## 2015-07-16 NOTE — Progress Notes (Signed)
Left message with details on pt voicemail.

## 2015-11-27 ENCOUNTER — Ambulatory Visit (INDEPENDENT_AMBULATORY_CARE_PROVIDER_SITE_OTHER): Payer: BLUE CROSS/BLUE SHIELD | Admitting: Family Medicine

## 2015-11-27 VITALS — BP 128/80 | HR 89 | Temp 98.1°F | Resp 20 | Ht 64.0 in | Wt 157.6 lb

## 2015-11-27 DIAGNOSIS — R05 Cough: Secondary | ICD-10-CM

## 2015-11-27 DIAGNOSIS — J309 Allergic rhinitis, unspecified: Secondary | ICD-10-CM | POA: Diagnosis not present

## 2015-11-27 DIAGNOSIS — J22 Unspecified acute lower respiratory infection: Secondary | ICD-10-CM

## 2015-11-27 DIAGNOSIS — J988 Other specified respiratory disorders: Secondary | ICD-10-CM | POA: Diagnosis not present

## 2015-11-27 DIAGNOSIS — R6889 Other general symptoms and signs: Secondary | ICD-10-CM

## 2015-11-27 DIAGNOSIS — R059 Cough, unspecified: Secondary | ICD-10-CM

## 2015-11-27 MED ORDER — BENZONATATE 100 MG PO CAPS
100.0000 mg | ORAL_CAPSULE | Freq: Three times a day (TID) | ORAL | Status: DC | PRN
Start: 2015-11-27 — End: 2017-03-17

## 2015-11-27 MED ORDER — AZITHROMYCIN 250 MG PO TABS: ORAL_TABLET | ORAL | Status: DC

## 2015-11-27 MED ORDER — HYDROCODONE-HOMATROPINE 5-1.5 MG/5ML PO SYRP: 5.0000 mL | ORAL_SOLUTION | Freq: Four times a day (QID) | ORAL | Status: DC | PRN

## 2015-11-27 NOTE — Patient Instructions (Signed)
For the chronic cough, continue allergy treatment with Flonase nasal spray, then Allegra or Zyrtec over-the-counter if needed. Prevent heartburn by avoiding spicy foods, peppermint, and over-the-counter Zantac or Pepcid if needed. If persistent dry cough, consider lisinopril as a cause, so return to discuss this further if cough does not improve within the next month or 2.   for current cough, suspect some of this is from the previous flu or flu-like illness. But with worsening, can start azithromycin in the next day or 2 if not improving with Tessalon Perles during the day and hydrocodone cough syrup as needed up to every 6 hours. Return to the clinic or go to the nearest emergency room if any of your symptoms worsen or new symptoms occur.   Cough, Adult Coughing is a reflex that clears your throat and your airways. Coughing helps to heal and protect your lungs. It is normal to cough occasionally, but a cough that happens with other symptoms or lasts a long time may be a sign of a condition that needs treatment. A cough may last only 2-3 weeks (acute), or it may last longer than 8 weeks (chronic). CAUSES Coughing is commonly caused by:  Breathing in substances that irritate your lungs.  A viral or bacterial respiratory infection.  Allergies.  Asthma.  Postnasal drip.  Smoking.  Acid backing up from the stomach into the esophagus (gastroesophageal reflux).  Certain medicines.  Chronic lung problems, including COPD (or rarely, lung cancer).  Other medical conditions such as heart failure. HOME CARE INSTRUCTIONS  Pay attention to any changes in your symptoms. Take these actions to help with your discomfort:  Take medicines only as told by your health care provider.  If you were prescribed an antibiotic medicine, take it as told by your health care provider. Do not stop taking the antibiotic even if you start to feel better.  Talk with your health care provider before you take a  cough suppressant medicine.  Drink enough fluid to keep your urine clear or pale yellow.  If the air is dry, use a cold steam vaporizer or humidifier in your bedroom or your home to help loosen secretions.  Avoid anything that causes you to cough at work or at home.  If your cough is worse at night, try sleeping in a semi-upright position.  Avoid cigarette smoke. If you smoke, quit smoking. If you need help quitting, ask your health care provider.  Avoid caffeine.  Avoid alcohol.  Rest as needed. SEEK MEDICAL CARE IF:   You have new symptoms.  You cough up pus.  Your cough does not get better after 2-3 weeks, or your cough gets worse.  You cannot control your cough with suppressant medicines and you are losing sleep.  You develop pain that is getting worse or pain that is not controlled with pain medicines.  You have a fever.  You have unexplained weight loss.  You have night sweats. SEEK IMMEDIATE MEDICAL CARE IF:  You cough up blood.  You have difficulty breathing.  Your heartbeat is very fast.   This information is not intended to replace advice given to you by your health care provider. Make sure you discuss any questions you have with your health care provider.   Document Released: 04/23/2011 Document Revised: 07/16/2015 Document Reviewed: 01/01/2015 [employer] Interactive Patient Education [employer]

## 2015-11-27 NOTE — Progress Notes (Signed)
Subjective:    Patient ID: Troy Valentine, male    DOB: 1961-06-25, 55 y.o.   MRN: [id number] By signing my name below, I, Littie Deeds, attest that this documentation has been prepared under the direction and in the presence of Meredith Staggers, MD.  Electronically Signed: Littie Deeds, Medical Scribe. 11/27/2015. 3:36 PM.  HPI HPI Comments: Troy Valentine is a 55 y.o. male with a history of DM, HLD, hypothyroidism, and HTN who presents to the [facility] complaining of a gradual onset, intermittent, worsening cough that started about 2-3 weeks ago, but worsened this past week. Over the past week, patient also began having fever (100-100.5 F), chills, sore throat, and headache (attributed to cough). The fever has since resolved. He has been taking Mucinex and Tylenol. He had not been tested for flu and had not been seen for this previously. The cough does not keep him up at night. Patient denies ear pain and breakthrough heartburn this past week. He also denies smoking. He does take lisinopril 20 mg for his HTN and has been on lisinopril for the past 2.5-3 years. Patient notes that he typically has intermittent cough associated with postnasal drip and a tickle in his throat during the fall, which he associates with allergies. He takes Flonase for this which helps. He did receive the flu shot this year.  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  Patient Active Problem List   Diagnosis Date Noted  . Hyperlipidemia LDL goal < 100 09/13/2012  . Hypertension 09/13/2012  . Hypothyroid 09/13/2012  . Diabetes mellitus (HCC) 09/13/2012  . Encounter for long-term (current) use of other medications 09/13/2012   Past Medical History  Diagnosis Date  . Hypertension   . Allergy   . Diabetes mellitus without complication (HCC)   . Heart murmur   . Thyroid disease    Past Surgical History  Procedure Laterality Date  . Coarctation of aorta excision     No Known  Allergies Prior to Admission medications   Medication Sig Start Date End Date Taking? Authorizing Provider  aspirin 81 MG tablet Take 81 mg by mouth daily.   Yes Historical Provider, MD  levothyroxine (SYNTHROID, LEVOTHROID) 88 MCG tablet TAKE 1 TABLET DAILY  NEEDS F/U FOR ADDITIONAL REFILLS 07/14/15  Yes Collie Siad English, PA  lisinopril (PRINIVIL,ZESTRIL) 20 MG tablet Take 1 tablet (20 mg total) by mouth daily. 07/14/15  Yes Collie Siad English, PA  metFORMIN (GLUCOPHAGE-XR) 500 MG 24 hr tablet Take 1 tablet (500 mg total) by mouth daily. 07/14/15  Yes Collie Siad English, PA  simvastatin (ZOCOR) 40 MG tablet Take 1 tablet (40 mg total) by mouth every evening. PATIENT NEEDS OFFICE VISIT FOR ADDITIONAL REFILLS 07/14/15  Yes Collie Siad English, PA  azithromycin (ZITHROMAX) 250 MG tablet Use as a zpack Patient not taking: Reported on 07/11/2015 09/27/14   Gwenlyn Found Copland, MD  benzonatate (TESSALON) 100 MG capsule Take 1 capsule (100 mg total) by mouth 3 (three) times daily as needed for cough. Patient not taking: Reported on 07/11/2015 09/27/14   Pearline Cables, MD  desoximetasone (TOPICORT) 0.25 % cream Apply topically 2 (two) times daily. Patient not taking: Reported on 11/27/2015 09/27/14   Gwenlyn Found Copland, MD  HYDROcodone-homatropine (HYCODAN) 5-1.5 MG/5ML syrup Take 5 mLs by mouth every 8 (eight) hours as needed for cough. Patient not taking: Reported on 07/11/2015 09/27/14   Pearline Cables, MD  omeprazole (PRILOSEC) 40 MG capsule Take 1 capsule (  40 mg total) by mouth daily. Patient not taking: Reported on 07/11/2015 09/27/14   Pearline Cables, MD   Social History   Social History  . Marital Status: Married    Spouse Name: N/A  . Number of Children: N/A  . Years of Education: N/A   Occupational History  . Not on file.   Social History Main Topics  . Smoking status: Never Smoker   . Smokeless tobacco: Not on file  . Alcohol Use: No  . Drug Use: No  . Sexual Activity: Yes    Birth  Control/ Protection: None   Other Topics Concern  . Not on file   Social History Narrative     Review of Systems  Constitutional: Positive for chills. Negative for fever.  HENT: Positive for sore throat. Negative for ear pain.   Respiratory: Positive for cough.   Gastrointestinal: Negative for abdominal pain.  Allergic/Immunologic: Positive for environmental allergies.  Neurological: Positive for headaches.       Objective:   Physical Exam  Constitutional: He is oriented to person, place, and time. He appears well-developed and well-nourished.  HENT:  Head: Normocephalic and atraumatic.  Right Ear: Tympanic membrane, external ear and ear canal normal.  Left Ear: Tympanic membrane, external ear and ear canal normal.  Nose: No rhinorrhea.  Mouth/Throat: Oropharynx is clear and moist and mucous membranes are normal. No oropharyngeal exudate, posterior oropharyngeal edema or posterior oropharyngeal erythema.  Slight edema of the right turbinates, no discharge. No posterior oropharynx drainage.  Eyes: Conjunctivae are normal. Pupils are equal, round, and reactive to light.  Neck: Neck supple.  AC node on right slightly enlarged, but not enlarged.  Cardiovascular: Normal rate, regular rhythm, normal heart sounds and intact distal pulses.   No murmur heard. Pulmonary/Chest: Effort normal and breath sounds normal. He has no wheezes. He has no rhonchi. He has no rales.  Clear to auscultation bilaterally.   Abdominal: Soft. There is no tenderness.  Neurological: He is alert and oriented to person, place, and time.  Skin: Skin is warm and dry. No rash noted.  Psychiatric: He has a normal mood and affect. His behavior is normal.  Vitals reviewed.   Filed Vitals:   11/27/15 1509  BP: 128/80  Pulse: 89  Temp: 98.1 F (36.7 C)  TempSrc: Oral  Resp: 20  Height:  (1.626 m)  Weight: 157 lb 9.6 oz (71.487 kg)  SpO2: 97%         Assessment & Plan:   Troy Valentine is  a 55 y.o. male Cough - Plan: benzonatate (TESSALON) 100 MG capsule, HYDROcodone-homatropine (HYCODAN) 5-1.5 MG/5ML syrup  LRTI (lower respiratory tract infection) - Plan: azithromycin (ZITHROMAX) 250 MG tablet  Flu-like symptoms  Allergic rhinitis, unspecified allergic rhinitis type  Possible irritant or chronic cough with allergies and postnasal drip, less likely ACE inhibitor cough. Possible LPR. Discuss allergy treatment and avoidance of triggers for reflux over-the-counter Zantac or Pepcid for reflux, and RTC if persistent chronic cough.  For current change in cough, likely flu or flulike illness previously now with secondary sickening, and early bronchitis. Start with Z-Pak, Jerilynn Som during the day, Hycodan cough syrup at night if needed. RTC precautions.  Meds ordered this encounter  Medications  . benzonatate (TESSALON) 100 MG capsule    Sig: Take 1 capsule (100 mg total) by mouth 3 (three) times daily as needed for cough.    Dispense:  20 capsule    Refill:  0  . HYDROcodone-homatropine (  HYCODAN) 5-1.5 MG/5ML syrup    Sig: Take 5 mLs by mouth every 6 (six) hours as needed.    Dispense:  120 mL    Refill:  0  . azithromycin (ZITHROMAX) 250 MG tablet    Sig: Take 2 pills by mouth on day 1, then 1 pill by mouth per day on days 2 through 5.    Dispense:  6 tablet    Refill:  0   Patient Instructions   For the chronic cough, continue allergy treatment with Flonase nasal spray, then Allegra or Zyrtec over-the-counter if needed. Prevent heartburn by avoiding spicy foods, peppermint, and over-the-counter Zantac or Pepcid if needed. If persistent dry cough, consider lisinopril as a cause, so return to discuss this further if cough does not improve within the next month or 2.   for current cough, suspect some of this is from the previous flu or flu-like illness. But with worsening, can start azithromycin in the next day or 2 if not improving with Tessalon Perles during the day and  hydrocodone cough syrup as needed up to every 6 hours. Return to the clinic or go to the nearest emergency room if any of your symptoms worsen or new symptoms occur.   Cough, Adult Coughing is a reflex that clears your throat and your airways. Coughing helps to heal and protect your lungs. It is normal to cough occasionally, but a cough that happens with other symptoms or lasts a long time may be a sign of a condition that needs treatment. A cough may last only 2-3 weeks (acute), or it may last longer than 8 weeks (chronic). CAUSES Coughing is commonly caused by:  Breathing in substances that irritate your lungs.  A viral or bacterial respiratory infection.  Allergies.  Asthma.  Postnasal drip.  Smoking.  Acid backing up from the stomach into the esophagus (gastroesophageal reflux).  Certain medicines.  Chronic lung problems, including COPD (or rarely, lung cancer).  Other medical conditions such as heart failure. HOME CARE INSTRUCTIONS  Pay attention to any changes in your symptoms. Take these actions to help with your discomfort:  Take medicines only as told by your health care provider.  If you were prescribed an antibiotic medicine, take it as told by your health care provider. Do not stop taking the antibiotic even if you start to feel better.  Talk with your health care provider before you take a cough suppressant medicine.  Drink enough fluid to keep your urine clear or pale yellow.  If the air is dry, use a cold steam vaporizer or humidifier in your bedroom or your home to help loosen secretions.  Avoid anything that causes you to cough at work or at home.  If your cough is worse at night, try sleeping in a semi-upright position.  Avoid cigarette smoke. If you smoke, quit smoking. If you need help quitting, ask your health care provider.  Avoid caffeine.  Avoid alcohol.  Rest as needed. SEEK MEDICAL CARE IF:   You have new symptoms.  You cough up  pus.  Your cough does not get better after 2-3 weeks, or your cough gets worse.  You cannot control your cough with suppressant medicines and you are losing sleep.  You develop pain that is getting worse or pain that is not controlled with pain medicines.  You have a fever.  You have unexplained weight loss.  You have night sweats. SEEK IMMEDIATE MEDICAL CARE IF:  You cough up blood.  You have  difficulty breathing.  Your heartbeat is very fast.   This information is not intended to replace advice given to you by your health care provider. Make sure you discuss any questions you have with your health care provider.   Document Released: 04/23/2011 Document Revised: 07/16/2015 Document Reviewed: 01/01/2015 Elsevier Interactive Patient Education [employer]     I personally performed the services described in this documentation, which was scribed in my presence. The recorded information has been reviewed and considered, and addended by me as needed.

## 2015-11-28 ENCOUNTER — Encounter: Payer: Self-pay | Admitting: Family Medicine

## 2015-12-03 ENCOUNTER — Encounter: Payer: Self-pay | Admitting: Family Medicine

## 2015-12-22 ENCOUNTER — Other Ambulatory Visit: Payer: Self-pay | Admitting: Physician Assistant

## 2016-01-09 ENCOUNTER — Other Ambulatory Visit: Payer: Self-pay | Admitting: Physician Assistant

## 2016-01-09 NOTE — Telephone Encounter (Signed)
STephanie did you want to send in for 90 days, he was supposed to follow up.

## 2016-01-12 NOTE — Telephone Encounter (Signed)
This was sent for 30 days.  He needs to follow up.  Please advise patient.

## 2016-01-29 ENCOUNTER — Telehealth: Payer: Self-pay

## 2016-01-29 DIAGNOSIS — E038 Other specified hypothyroidism: Secondary | ICD-10-CM

## 2016-01-29 MED ORDER — METFORMIN HCL ER 500 MG PO TB24: 500.0000 mg | ORAL_TABLET | Freq: Every day | ORAL | Status: DC

## 2016-01-29 MED ORDER — LISINOPRIL 20 MG PO TABS: 20.0000 mg | ORAL_TABLET | Freq: Every day | ORAL | Status: DC

## 2016-01-29 MED ORDER — LEVOTHYROXINE SODIUM 88 MCG PO TABS: ORAL_TABLET | ORAL | Status: DC

## 2016-01-29 MED ORDER — SIMVASTATIN 40 MG PO TABS: ORAL_TABLET | ORAL | Status: DC

## 2016-01-29 NOTE — Telephone Encounter (Signed)
[facility] having some issues. He will need his lisinopril (PRINIVIL,ZESTRIL) 20 MG tablet, metFORMIN (GLUCOPHAGE-XR) 500 MG 24 hr tablet, levothyroxine (SYNTHROID, LEVOTHROID) 88 MCG tablet, simvastatin (ZOCOR) 40 MG tablet sent to [facility] at [place].  Please check with pt on the last medication just to make sure that is the right one  [phone]

## 2016-01-29 NOTE — Telephone Encounter (Signed)
SPoke with pt, he states he just needs one more refill and he will come in for follow up. I advised pt that this would be the last Rx. Pt understood.

## 2016-01-29 NOTE — Telephone Encounter (Signed)
Garnetta BuddyStephanie D English, PA at 01/12/2016 3:17 PM     Status: Signed       Expand All Collapse All   This was sent for 30 days. He needs to follow up. Please advise patient.

## 2016-02-03 ENCOUNTER — Other Ambulatory Visit: Payer: Self-pay | Admitting: Physician Assistant

## 2016-02-19 DIAGNOSIS — Z8774 Personal history of (corrected) congenital malformations of heart and circulatory system: Secondary | ICD-10-CM

## 2016-02-19 DIAGNOSIS — K219 Gastro-esophageal reflux disease without esophagitis: Secondary | ICD-10-CM

## 2016-02-19 HISTORY — DX: Personal history of (corrected) congenital malformations of heart and circulatory system: Z87.74

## 2016-02-19 HISTORY — DX: Gastro-esophageal reflux disease without esophagitis: K21.9

## 2017-03-17 ENCOUNTER — Encounter: Payer: Self-pay | Admitting: Family Medicine

## 2017-03-17 ENCOUNTER — Ambulatory Visit (INDEPENDENT_AMBULATORY_CARE_PROVIDER_SITE_OTHER): Payer: BLUE CROSS/BLUE SHIELD | Admitting: Family Medicine

## 2017-03-17 VITALS — BP 129/80 | HR 66 | Temp 97.8°F | Resp 17 | Ht 63.75 in | Wt 159.0 lb

## 2017-03-17 DIAGNOSIS — Z1159 Encounter for screening for other viral diseases: Secondary | ICD-10-CM | POA: Diagnosis not present

## 2017-03-17 DIAGNOSIS — E038 Other specified hypothyroidism: Secondary | ICD-10-CM | POA: Diagnosis not present

## 2017-03-17 DIAGNOSIS — Z23 Encounter for immunization: Secondary | ICD-10-CM | POA: Diagnosis not present

## 2017-03-17 DIAGNOSIS — Z114 Encounter for screening for human immunodeficiency virus [HIV]: Secondary | ICD-10-CM

## 2017-03-17 DIAGNOSIS — E119 Type 2 diabetes mellitus without complications: Secondary | ICD-10-CM

## 2017-03-17 DIAGNOSIS — Z0000 Encounter for general adult medical examination without abnormal findings: Secondary | ICD-10-CM

## 2017-03-17 NOTE — Patient Instructions (Addendum)
It was very good to meet you today.  Everything looks good from my standpoint.   We are checking labs today and will let you know the results.     Health Maintenance, Male A healthy lifestyle and preventive care is important for your health and wellness. Ask your health care provider about what schedule of regular examinations is right for you. What should I know about weight and diet?  Eat a Healthy Diet  Eat plenty of vegetables, fruits, whole grains, low-fat dairy products, and lean protein.  Do not eat a lot of foods high in solid fats, added sugars, or salt. Maintain a Healthy Weight  Regular exercise can help you achieve or maintain a healthy weight. You should:  Do at least 150 minutes of exercise each week. The exercise should increase your heart rate and make you sweat (moderate-intensity exercise).  Do strength-training exercises at least twice a week. Watch Your Levels of Cholesterol and Blood Lipids  Have your blood tested for lipids and cholesterol every 5 years starting at 55 years of age. If you are at high risk for heart disease, you should start having your blood tested when you are 55 years old. You may need to have your cholesterol levels checked more often if:  Your lipid or cholesterol levels are high.  You are older than 55 years of age.  You are at high risk for heart disease. What should I know about cancer screening? Many types of cancers can be detected early and may often be prevented. Lung Cancer  You should be screened every year for lung cancer if:  You are a current smoker who has smoked for at least 30 years.  You are a former smoker who has quit within the past 15 years.  Talk to your health care provider about your screening options, when you should start screening, and how often you should be screened. Colorectal Cancer  Routine colorectal cancer screening usually begins at 55 years of age and should be repeated every 5-10 years until you  are 55 years old. You may need to be screened more often if early forms of precancerous polyps or small growths are found. Your health care provider may recommend screening at an earlier age if you have risk factors for colon cancer.  Your health care provider may recommend using home test kits to check for hidden blood in the stool.  A small camera at the end of a tube can be used to examine your colon (sigmoidoscopy or colonoscopy). This checks for the earliest forms of colorectal cancer. Prostate and Testicular Cancer  Depending on your age and overall health, your health care provider may do certain tests to screen for prostate and testicular cancer.  Talk to your health care provider about any symptoms or concerns you have about testicular or prostate cancer. Skin Cancer  Check your skin from head to toe regularly.  Tell your health care provider about any new moles or changes in moles, especially if:  There is a change in a mole's size, shape, or color.  You have a mole that is larger than a pencil eraser.  Always use sunscreen. Apply sunscreen liberally and repeat throughout the day.  Protect yourself by wearing long sleeves, pants, a wide-brimmed hat, and sunglasses when outside. What should I know about heart disease, diabetes, and high blood pressure?  If you are 62-4 years of age, have your blood pressure checked every 3-5 years. If you are 76 years of age  or older, have your blood pressure checked every year. You should have your blood pressure measured twice-once when you are at a hospital or clinic, and once when you are not at a hospital or clinic. Record the average of the two measurements. To check your blood pressure when you are not at a hospital or clinic, you can use:  An automated blood pressure machine at a pharmacy.  A home blood pressure monitor.  Talk to your health care provider about your target blood pressure.  If you are between 9045-55 years old, ask  your health care provider if you should take aspirin to prevent heart disease.  Have regular diabetes screenings by checking your fasting blood sugar level.  If you are at a normal weight and have a low risk for diabetes, have this test once every three years after the age of 55.  If you are overweight and have a high risk for diabetes, consider being tested at a younger age or more often.  A one-time screening for abdominal aortic aneurysm (AAA) by ultrasound is recommended for men aged 65-75 years who are current or former smokers. What should I know about preventing infection? Hepatitis B  If you have a higher risk for hepatitis B, you should be screened for this virus. Talk with your health care provider to find out if you are at risk for hepatitis B infection. Hepatitis C  Blood testing is recommended for:  Everyone born from [id number] through 1964.  Anyone with known risk factors for hepatitis C. Sexually Transmitted Diseases (STDs)  You should be screened each year for STDs including gonorrhea and chlamydia if:  You are sexually active and are younger than 55 years of age.  You are older than 55 years of age and your health care provider tells you that you are at risk for this type of infection.  Your sexual activity has changed since you were last screened and you are at an increased risk for chlamydia or gonorrhea. Ask your health care provider if you are at risk.  Talk with your health care provider about whether you are at high risk of being infected with HIV. Your health care provider may recommend a prescription medicine to help prevent HIV infection. What else can I do?  Schedule regular health, dental, and eye exams.  Stay current with your vaccines (immunizations).  Do not use any tobacco products, such as cigarettes, chewing tobacco, and e-cigarettes. If you need help quitting, ask your health care provider.  Limit alcohol intake to no more than 2 drinks per day. One  drink equals 12 ounces of beer, 5 ounces of wine, or 1 ounces of hard liquor.  Do not use street drugs.  Do not share needles.  Ask your health care provider for help if you need support or information about quitting drugs.  Tell your health care provider if you often feel depressed.  Tell your health care provider if you have ever been abused or do not feel safe at home. This information is not intended to replace advice given to you by your health care provider. Make sure you discuss any questions you have with your health care provider. Document Released: 04/22/2008 Document Revised: 06/23/2016 Document Reviewed: 07/29/2015 Elsevier Interactive Patient Education  2017 [employer]     IF you received an x-ray today, you will receive an invoice from [facility]. Please contact [facility] at [phone] with questions or concerns regarding your invoice.   IF you received labwork  today, you will receive an invoice from [facility]. Please contact [facility] at [phone] with questions or concerns regarding your invoice.   Our billing staff will not be able to assist you with questions regarding bills from these companies.  You will be contacted with the lab results as soon as they are available. The fastest way to get your results is to activate your My Chart account. Instructions are located on the last page of this paperwork. If you have not heard from [name] regarding the results in 2 weeks, please contact this office.

## 2017-03-17 NOTE — Progress Notes (Signed)
Troy Valentine is a 56 y.o. male who presents to [facility] today for comprehensive physical examination:  CPE  Concerns:  Feels well, no concerns.    Last physical September of last year Tetanus reports receiving - not in records Flu vaccine last year Eye exam:  Annually -- followed by diabetic retinal specialist. Dental exam every six months or so.     PMH reviewed. Patient is a nonsmoker.   Past Medical History:  Diagnosis Date  . Allergy   . Diabetes mellitus without complication (HCC)   . Heart murmur   . Hypertension   . Thyroid disease    Past Surgical History:  Procedure Laterality Date  . COARCTATION OF AORTA EXCISION      Medications reviewed. Current Outpatient Prescriptions  Medication Sig Dispense Refill  . aspirin 81 MG tablet Take 81 mg by mouth daily.    Marland Kitchen levothyroxine (SYNTHROID, LEVOTHROID) 88 MCG tablet TAKE 1 TABLET DAILY  NEEDS F/U FOR ADDITIONAL REFILLS 30 tablet 0  . lisinopril (PRINIVIL,ZESTRIL) 20 MG tablet Take 1 tablet (20 mg total) by mouth daily. 30 tablet 0  . metFORMIN (GLUCOPHAGE-XR) 500 MG 24 hr tablet Take 1 tablet (500 mg total) by mouth daily. 30 tablet 0  . simvastatin (ZOCOR) 40 MG tablet TAKE 1 TABLET EVERY EVENING (NEED OFFICE VISIT FOR ADDITIONAL REFILLS) 30 tablet 0   No current facility-administered medications for this visit.     Social: Smoking history:  never Alcohol use:  denies Illicit drug use:  denies Relationship status:   married Occupation:  Physical therapy  Family History:  18 yo Father recently diagnosed with prostate CA. Also with glaucoma.  Mother with HTN.  Otherwise negative.    Review of Systems  Constitutional: Negative for fever.  HENT: Negative for congestion, ear discharge, ear pain and hearing loss.   Eyes: Negative for blurred vision.  Respiratory: Negative for cough and wheezing.   Cardiovascular: Negative for chest pain, palpitations and leg swelling.    Gastrointestinal: Negative for nausea, vomiting and abdominal pain.  Genitourinary: Negative for dysuria, hematuria and flank pain.  Musculoskeletal: Negative for neck pain.  Skin: Negative for rash.  Neurological: Negative for dizziness and headaches.  Psychiatric/Behavioral: Negative for depression and suicidal ideas.   Exam: BP 129/80 (BP Location: Right Arm, Patient Position: Sitting, Cuff Size: Normal)   Pulse 66   Temp 97.8 F (36.6 C) (Oral)   Resp 17   Ht 5' 3.75" (1.619 m)   Wt 159 lb (72.1 kg)   SpO2 100%   BMI 27.51 kg/m  Gen:  Alert, cooperative patient who appears stated age in no acute distress.  Vital signs reviewed. Head: /AT.   Eyes:  EOMI, PERRL.   Ears:  External ears WNL, Bilateral TM's normal without retraction, redness or bulging. Nose:  Septum midline  Mouth:  MMM, tonsils non-erythematous, non-edematous.   Neck: No masses or thyromegaly or limitation in range of motion.  No cervical lymphadenopathy. Pulm:  Clear to auscultation bilaterally with good air movement.  No wheezes or rales noted.   Cardiac:  Regular rate and rhythm  Abd:  Soft/nondistended/nontender.   Ext:  No clubbing/cyanosis/erythema.  No edema noted bilateral lower extremities.   Foot exam: No deformities, ulcerations, or other skin breakdown BL feet.  Sensation intact to monofilament and light touch.  PT and DP pulses intact BL.   Neuro:  Grossly normal, no gait abnormalities Psych:  Not depressed or anxious appearing.  Conversant and  engaged  Impression/Plan: 1. Complete Physical Examination: anticipatory guidance provided. Doing well, no concerns.  2.  Vaccines:  S/p TDAP and Pneumovax today.  He has already received Prevnar.   3.  Screening cholesterol: obtain FLP. 4.  Checking thyroid and other basic labs - refill medications after labs return.   - screening HIV and Hep B - overdue for colonoscopy -- however he deferred this until he returns from [place] in the fall.   - foot  exam today.  A1C today.

## 2017-03-18 LAB — COMPREHENSIVE METABOLIC PANEL
ALK PHOS: 56 IU/L (ref 39–117)
ALT: 23 IU/L (ref 0–44)
AST: 20 IU/L (ref 0–40)
Albumin/Globulin Ratio: 1.9 (ref 1.2–2.2)
Albumin: 4.6 g/dL (ref 3.5–5.5)
BUN/Creatinine Ratio: 14 (ref 9–20)
BUN: 13 mg/dL (ref 6–24)
Bilirubin Total: 0.7 mg/dL (ref 0.0–1.2)
CALCIUM: 9.6 mg/dL (ref 8.7–10.2)
CO2: 26 mmol/L (ref 18–29)
CREATININE: 0.94 mg/dL (ref 0.76–1.27)
Chloride: 99 mmol/L (ref 96–106)
GFR calc Af Amer: 106 mL/min/{1.73_m2} (ref >59)
GFR calc non Af Amer: 92 mL/min/{1.73_m2} (ref >59)
GLOBULIN, TOTAL: 2.4 g/dL (ref 1.5–4.5)
Glucose: 113 mg/dL — ABNORMAL HIGH (ref 65–99)
Potassium: 4.7 mmol/L (ref 3.5–5.2)
SODIUM: 139 mmol/L (ref 134–144)
Total Protein: 7 g/dL (ref 6.0–8.5)

## 2017-03-18 LAB — CBC
HEMATOCRIT: 46.9 % (ref 37.5–51.0)
Hemoglobin: 16 g/dL (ref 13.0–17.7)
MCH: 29.3 pg (ref 26.6–33.0)
MCHC: 34.1 g/dL (ref 31.5–35.7)
MCV: 86 fL (ref 79–97)
Platelets: 226 10*3/uL (ref 150–379)
RBC: 5.47 x10E6/uL (ref 4.14–5.80)
RDW: 13.5 % (ref 12.3–15.4)
WBC: 7.7 10*3/uL (ref 3.4–10.8)

## 2017-03-18 LAB — HEMOGLOBIN A1C
ESTIMATED AVERAGE GLUCOSE: 128 mg/dL
Hgb A1c MFr Bld: 6.1 % — ABNORMAL HIGH (ref 4.8–5.6)

## 2017-03-18 LAB — LIPID PANEL
CHOLESTEROL TOTAL: 144 mg/dL (ref 100–199)
Chol/HDL Ratio: 2.8 ratio (ref 0.0–5.0)
HDL: 52 mg/dL (ref >39)
LDL Calculated: 65 mg/dL (ref 0–99)
Triglycerides: 137 mg/dL (ref 0–149)
VLDL CHOLESTEROL CAL: 27 mg/dL (ref 5–40)

## 2017-03-18 LAB — HIV ANTIBODY (ROUTINE TESTING W REFLEX): HIV SCREEN 4TH GENERATION: NONREACTIVE

## 2017-03-18 LAB — TSH: TSH: 0.828 u[IU]/mL (ref 0.450–4.500)

## 2017-03-18 LAB — HEPATITIS C ANTIBODY

## 2017-03-18 MED ORDER — SIMVASTATIN 40 MG PO TABS: ORAL_TABLET | ORAL | 0 refills | Status: DC

## 2017-03-18 MED ORDER — METFORMIN HCL ER 500 MG PO TB24: 500.0000 mg | ORAL_TABLET | Freq: Every day | ORAL | 0 refills | Status: DC

## 2017-03-18 MED ORDER — LEVOTHYROXINE SODIUM 88 MCG PO TABS: ORAL_TABLET | ORAL | 0 refills | Status: DC

## 2017-03-18 MED ORDER — LISINOPRIL 20 MG PO TABS: 20.0000 mg | ORAL_TABLET | Freq: Every day | ORAL | 0 refills | Status: DC

## 2017-04-12 ENCOUNTER — Other Ambulatory Visit: Payer: Self-pay | Admitting: Family Medicine

## 2017-04-12 DIAGNOSIS — E038 Other specified hypothyroidism: Secondary | ICD-10-CM

## 2017-04-12 NOTE — Telephone Encounter (Signed)
DR Gwendolyn GrantWALDEN PT IS CALLING FOR A 90 DAY REFILL ON HIS LISINOPRIL METFORMIN AND SIMVASTATIN HE WILL BE GOING OUT OF THE COUNTRY PLEASE SEND TO [facility] IN [place]

## 2017-04-13 ENCOUNTER — Telehealth: Payer: Self-pay | Admitting: Family Medicine

## 2017-04-13 ENCOUNTER — Other Ambulatory Visit: Payer: Self-pay | Admitting: Family Medicine

## 2017-04-13 DIAGNOSIS — E038 Other specified hypothyroidism: Secondary | ICD-10-CM

## 2017-04-13 NOTE — Telephone Encounter (Signed)
Pt is checking on status of request of meds before he goes out of the country   Best number [phone]

## 2017-04-14 MED ORDER — LEVOTHYROXINE SODIUM 88 MCG PO TABS: ORAL_TABLET | ORAL | 0 refills | Status: DC

## 2017-04-14 MED ORDER — SIMVASTATIN 40 MG PO TABS: ORAL_TABLET | ORAL | 0 refills | Status: DC

## 2017-04-14 MED ORDER — METFORMIN HCL ER 500 MG PO TB24: 500.0000 mg | ORAL_TABLET | Freq: Every day | ORAL | 0 refills | Status: DC

## 2017-04-14 MED ORDER — LISINOPRIL 20 MG PO TABS: 20.0000 mg | ORAL_TABLET | Freq: Every day | ORAL | 0 refills | Status: DC

## 2017-04-15 NOTE — Telephone Encounter (Signed)
Filled yesterday

## 2017-06-15 DIAGNOSIS — Z1159 Encounter for screening for other viral diseases: Secondary | ICD-10-CM | POA: Insufficient documentation

## 2017-06-15 HISTORY — DX: Encounter for screening for other viral diseases: Z11.59

## 2017-06-20 ENCOUNTER — Ambulatory Visit: Payer: Self-pay | Admitting: Family Medicine

## 2017-07-13 ENCOUNTER — Telehealth: Payer: Self-pay | Admitting: Family Medicine

## 2017-07-13 NOTE — Telephone Encounter (Signed)
Caller name: Guenther Relation to pt: self  Call back number: [phone] Pharmacy:  Reason for call: Pt called stating received a bill for a no show fee on 06-20-2017, pt stated did call our office on 06-17-2017 to cancel appt and also text message received and stated was not coming to the appt to have it canceled and pt still received a no show charge. Pt would like to have some one look it over and disregard the charge. Please advise.

## 2017-08-30 NOTE — Telephone Encounter (Signed)
Pt called for resolve of $50.00 bill. Call pt [phone] about voiding NoShow Fee from 06/20/2017.

## 2017-08-31 NOTE — Telephone Encounter (Signed)
That's fine. I'm not sure how to verify this?

## 2017-08-31 NOTE — Telephone Encounter (Signed)
Patient requesting $50 no show fee for 06/20/17 new patient appointment waived, stating he called on 06/17/17 cancelling appointment, please advise

## 2018-08-28 ENCOUNTER — Encounter: Payer: Self-pay | Admitting: Family Medicine

## 2018-08-28 ENCOUNTER — Ambulatory Visit (INDEPENDENT_AMBULATORY_CARE_PROVIDER_SITE_OTHER): Payer: 59 | Admitting: Family Medicine

## 2018-08-28 VITALS — BP 122/80 | HR 84 | Temp 98.1°F | Ht 64.0 in | Wt 159.2 lb

## 2018-08-28 DIAGNOSIS — E785 Hyperlipidemia, unspecified: Secondary | ICD-10-CM

## 2018-08-28 DIAGNOSIS — E038 Other specified hypothyroidism: Secondary | ICD-10-CM

## 2018-08-28 DIAGNOSIS — Z1211 Encounter for screening for malignant neoplasm of colon: Secondary | ICD-10-CM

## 2018-08-28 DIAGNOSIS — E119 Type 2 diabetes mellitus without complications: Secondary | ICD-10-CM | POA: Diagnosis not present

## 2018-08-28 DIAGNOSIS — Z23 Encounter for immunization: Secondary | ICD-10-CM

## 2018-08-28 DIAGNOSIS — I10 Essential (primary) hypertension: Secondary | ICD-10-CM

## 2018-08-28 DIAGNOSIS — Z0000 Encounter for general adult medical examination without abnormal findings: Secondary | ICD-10-CM

## 2018-08-28 LAB — COMPREHENSIVE METABOLIC PANEL
ALBUMIN: 4.6 g/dL (ref 3.5–5.2)
ALK PHOS: 57 U/L (ref 39–117)
ALT: 20 U/L (ref 0–53)
AST: 16 U/L (ref 0–37)
BILIRUBIN TOTAL: 1.2 mg/dL (ref 0.2–1.2)
BUN: 14 mg/dL (ref 6–23)
CALCIUM: 9.7 mg/dL (ref 8.4–10.5)
CO2: 28 mEq/L (ref 19–32)
CREATININE: 0.97 mg/dL (ref 0.40–1.50)
Chloride: 102 mEq/L (ref 96–112)
GFR: 85.12 mL/min (ref >60.00)
Glucose, Bld: 132 mg/dL — ABNORMAL HIGH (ref 70–99)
Potassium: 4.2 mEq/L (ref 3.5–5.1)
Sodium: 139 mEq/L (ref 135–145)
TOTAL PROTEIN: 7.1 g/dL (ref 6.0–8.3)

## 2018-08-28 LAB — CBC
HCT: 47.7 % (ref 39.0–52.0)
Hemoglobin: 16.5 g/dL (ref 13.0–17.0)
MCHC: 34.5 g/dL (ref 30.0–36.0)
MCV: 84.8 fl (ref 78.0–100.0)
PLATELETS: 223 10*3/uL (ref 150.0–400.0)
RBC: 5.62 Mil/uL (ref 4.22–5.81)
RDW: 13.5 % (ref 11.5–15.5)
WBC: 6.6 10*3/uL (ref 4.0–10.5)

## 2018-08-28 LAB — LIPID PANEL
Cholesterol: 139 mg/dL (ref 0–200)
HDL: 49.2 mg/dL (ref >39.00)
LDL Cholesterol: 73 mg/dL (ref 0–99)
NonHDL: 90.26
TRIGLYCERIDES: 84 mg/dL (ref 0.0–149.0)
Total CHOL/HDL Ratio: 3
VLDL: 16.8 mg/dL (ref 0.0–40.0)

## 2018-08-28 LAB — MICROALBUMIN / CREATININE URINE RATIO
Creatinine,U: 257.9 mg/dL
MICROALB/CREAT RATIO: 0.6 mg/g (ref 0.0–30.0)
Microalb, Ur: 1.7 mg/dL (ref 0.0–1.9)

## 2018-08-28 LAB — TSH: TSH: 0.59 u[IU]/mL (ref 0.35–4.50)

## 2018-08-28 LAB — HEMOGLOBIN A1C: Hgb A1c MFr Bld: 5.9 % (ref 4.6–6.5)

## 2018-08-28 MED ORDER — LEVOTHYROXINE SODIUM 88 MCG PO TABS: ORAL_TABLET | ORAL | 2 refills | Status: DC

## 2018-08-28 MED ORDER — LISINOPRIL 20 MG PO TABS: 20.0000 mg | ORAL_TABLET | Freq: Every day | ORAL | 2 refills | Status: DC

## 2018-08-28 MED ORDER — SIMVASTATIN 40 MG PO TABS: ORAL_TABLET | ORAL | 2 refills | Status: DC

## 2018-08-28 MED ORDER — METFORMIN HCL ER 500 MG PO TB24: 500.0000 mg | ORAL_TABLET | Freq: Every day | ORAL | 2 refills | Status: DC

## 2018-08-28 NOTE — Patient Instructions (Addendum)
The new Shingrix vaccine (for shingles) is a 2 shot series. It can make people feel low energy, achy and almost like they have the flu for 48 hours after injection. Please plan accordingly when deciding on when to get this shot. Call our office for a nurse visit appointment to get this. The second shot of the series is less severe regarding the side effects, but it still lasts 48 hours.   Give [name] 2-3 business days to get the results of your labs back.   Keep up the good work.   Please have your eye care provider send [name] your notes after you see them.  Let us know if you need anything.

## 2018-08-28 NOTE — Progress Notes (Signed)
Pre visit review using our clinic review tool, if applicable. No additional management support is needed unless otherwise documented below in the visit note. 

## 2018-08-28 NOTE — Progress Notes (Signed)
Chief Complaint  Patient presents with  . New Patient (Initial Visit)    Well Male Troy Valentine is here for a complete physical.   His last physical was >1 year ago.  Current diet: in general, a "healthy" diet- vegetarian.  Current exercise: 4x/week walking, pushups, pullups Weight trend: stable Daytime fatigue? No. Seat belt? Yes.    Health maintenance Shingrix- No Colonoscopy- No Tetanus- Yes HIV- Yes Hep C- Yes Prostate cancer screening- No  Due for eye exam- Dr [facility]   Past Medical History:  Diagnosis Date  . Allergy   . Diabetes mellitus without complication (HCC)   . Heart murmur   . Hypertension   . Thyroid disease       Past Surgical History:  Procedure Laterality Date  . COARCTATION OF AORTA EXCISION      Medications  Current Outpatient Medications on File Prior to Visit  Medication Sig Dispense Refill  . aspirin 81 MG tablet Take 81 mg by mouth daily.     No current facility-administered medications on file prior to visit.      Allergies No Known Allergies  Family History Family History  Problem Relation Age of Onset  . Hypertension Mother   . Kidney disease Mother     Review of Systems: Constitutional:  no fevers Eye:  no recent significant change in vision Ear/Nose/Mouth/Throat:  Ears:  no hearing loss Nose/Mouth/Throat:  no complaints of nasal congestion, no sore throat Cardiovascular:  no chest pain, no palpitations Respiratory:  no cough and no shortness of breath Gastrointestinal:  no abdominal pain, no change in bowel habits GU:  Male: negative for dysuria, frequency, and incontinence and negative for prostate symptoms Musculoskeletal/Extremities:  no pain, redness, or swelling of the joints Integumentary (Skin/Breast):  no abnormal skin lesions reported Neurologic:  no headaches Endocrine: No unexpected weight changes Hematologic/Lymphatic:  no abnormal bleeding  Exam BP 122/80 (BP Location: Left Arm,  Patient Position: Sitting, Cuff Size: Normal)   Pulse 84   Temp 98.1 F (36.7 C) (Oral)   Ht 5\' 4"  (1.626 m)   Wt 159 lb 4 oz (72.2 kg)   SpO2 97%   BMI 27.34 kg/m  General:  well developed, well nourished, in no apparent distress Skin:  no significant moles, warts, or growths Head:  no masses, lesions, or tenderness Eyes:  pupils equal and round, sclera anicteric without injection Ears:  canals without lesions, TMs shiny without retraction, no obvious effusion, no erythema Nose:  nares patent, septum midline, mucosa normal Throat/Pharynx:  lips and gingiva without lesion; tongue and uvula midline; non-inflamed pharynx; no exudates or postnasal drainage Neck: neck supple without adenopathy, thyromegaly, or masses Lungs:  clear to auscultation, breath sounds equal bilaterally, no respiratory distress Cardio:  regular rate and rhythm, no LE edema, no bruits Abdomen:  abdomen soft, nontender; bowel sounds normal; no masses or organomegaly Genital (male): circumcised penis, no lesions or discharge; testes present bilaterally without masses or tenderness Rectal: Deferred Musculoskeletal:  symmetrical muscle groups noted without atrophy or deformity Extremities:  no clubbing, cyanosis, or edema, no deformities, no skin discoloration Neuro:  gait normal; deep tendon reflexes normal and symmetric Psych: well oriented with normal range of affect and appropriate judgment/insight  Assessment and Plan  Well adult exam - Plan: TSH, Lipid panel, Comprehensive metabolic panel, CBC  Screen for colon cancer - Plan: Ambulatory referral to Gastroenterology  Type 2 diabetes mellitus without complication, without long-term current use of insulin (HCC) - Plan: Microalbumin /  creatinine urine ratio, Hemoglobin A1c, HM DIABETES FOOT EXAM, metFORMIN (GLUCOPHAGE-XR) 500 MG 24 hr tablet  Influenza vaccine administered - Plan: Flu Vaccine QUAD 6+ mos PF IM (Fluarix Quad PF)  Other specified hypothyroidism -  Plan: levothyroxine (SYNTHROID, LEVOTHROID) 88 MCG tablet  Hyperlipidemia with target low density lipoprotein (LDL) cholesterol less than 100 mg/dL - Plan: simvastatin (ZOCOR) 40 MG tablet  Essential hypertension - Plan: lisinopril (PRINIVIL,ZESTRIL) 20 MG tablet   Well 57 y.o. male. Counseled on diet and exercise. Counseled on risks and benefits of prostate cancer screening with PSA. The patient agrees to forego testing. Has eye appt coming up, will let us know.  Immunizations, labs, and further orders as above. Follow up in 6 mo or prn. The patient voiced understanding and agreement to the plan.  Jilda Roche [place], DO 08/28/18 9:20 AM

## 2018-09-18 ENCOUNTER — Ambulatory Visit (INDEPENDENT_AMBULATORY_CARE_PROVIDER_SITE_OTHER): Payer: 59 | Admitting: Cardiology

## 2018-09-18 ENCOUNTER — Encounter: Payer: Self-pay | Admitting: Cardiology

## 2018-09-18 VITALS — BP 126/68 | HR 86 | Ht 64.0 in | Wt 161.0 lb

## 2018-09-18 DIAGNOSIS — I10 Essential (primary) hypertension: Secondary | ICD-10-CM | POA: Diagnosis not present

## 2018-09-18 DIAGNOSIS — E785 Hyperlipidemia, unspecified: Secondary | ICD-10-CM

## 2018-09-18 DIAGNOSIS — E088 Diabetes mellitus due to underlying condition with unspecified complications: Secondary | ICD-10-CM | POA: Insufficient documentation

## 2018-09-18 DIAGNOSIS — Z8774 Personal history of (corrected) congenital malformations of heart and circulatory system: Secondary | ICD-10-CM

## 2018-09-18 DIAGNOSIS — Z9189 Other specified personal risk factors, not elsewhere classified: Secondary | ICD-10-CM | POA: Diagnosis not present

## 2018-09-18 HISTORY — DX: Diabetes mellitus due to underlying condition with unspecified complications: E08.8

## 2018-09-18 MED ORDER — ATORVASTATIN CALCIUM 20 MG PO TABS: 20.0000 mg | ORAL_TABLET | Freq: Every day | ORAL | 2 refills | Status: DC

## 2018-09-18 NOTE — Addendum Note (Signed)
Addended by: Craige Cotta on: 09/18/2018 04:15 PM   Modules accepted: Orders

## 2018-09-18 NOTE — Patient Instructions (Signed)
Medication Instructions:  Your physician has recommended you make the following change in your medication:  STOP simvastatin START lipitor 20 mg daily  If you need a refill on your cardiac medications before your next appointment, please call your pharmacy.   Lab work: Your physician recommends that you have the following labs drawn: liver and lipid panel to be done in 6 weeks, no appointment needed. Please come fasting for these labs.  If you have labs (blood work) drawn today and your tests are completely normal, you will receive your results only by: Marland Kitchen MyChart Message (if you have MyChart) OR . A paper copy in the mail If you have any lab test that is abnormal or we need to change your treatment, we will call you to review the results.  Testing/Procedures:  MRI of the chest and brain  Follow-Up: At [facility], you and your health needs are our priority.  As part of our continuing mission to provide you with exceptional heart care, we have created designated Provider Care Teams.  These Care Teams include your primary Cardiologist (physician) and Advanced Practice Providers (APPs -  Physician Assistants and Nurse Practitioners) who all work together to provide you with the care you need, when you need it.  You will need a follow up appointment in 6 months.  Please call our office 2 months in advance to schedule this appointment.  You may see another member of our [employer] Provider Team in [place]: Gypsy Balsam, MD . Norman Herrlich, MD  Any Other Special Instructions Will Be Listed Below (If Applicable).  Atorvastatin tablets What is this medicine? ATORVASTATIN (a TORE va sta tin) is known as a HMG-CoA reductase inhibitor or 'statin'. It lowers the level of cholesterol and triglycerides in the blood. This drug may also reduce the risk of heart attack, stroke, or other health problems in patients with risk factors for heart disease. Diet and lifestyle changes are often  used with this drug. This medicine may be used for other purposes; ask your health care provider or pharmacist if you have questions. COMMON BRAND NAME(S): Lipitor What should I tell my health care provider before I take this medicine? They need to know if you have any of these conditions: -frequently drink alcoholic beverages -history of stroke, TIA -kidney disease -liver disease -muscle aches or weakness -other medical condition -an unusual or allergic reaction to atorvastatin, other medicines, foods, dyes, or preservatives -pregnant or trying to get pregnant -breast-feeding How should I use this medicine? Take this medicine by mouth with a glass of water. Follow the directions on the prescription label. You can take this medicine with or without food. Take your doses at regular intervals. Do not take your medicine more often than directed. Talk to your pediatrician regarding the use of this medicine in children. While this drug may be prescribed for children as young as 63 years old for selected conditions, precautions do apply. Overdosage: If you think you have taken too much of this medicine contact a poison control center or emergency room at once. NOTE: This medicine is only for you. Do not share this medicine with others. What if I miss a dose? If you miss a dose, take it as soon as you can. If it is almost time for your next dose, take only that dose. Do not take double or extra doses. What may interact with this medicine? Do not take this medicine with any of the following medications: -red yeast rice -telaprevir -telithromycin -  voriconazole This medicine may also interact with the following medications: -alcohol -antiviral medicines for HIV or AIDS -boceprevir -certain antibiotics like clarithromycin, erythromycin, troleandomycin -certain medicines for cholesterol like fenofibrate or gemfibrozil -cimetidine -clarithromycin -colchicine -cyclosporine -digoxin -male  hormones, like estrogens or progestins and birth control pills -grapefruit juice -medicines for fungal infections like fluconazole, itraconazole, ketoconazole -niacin -rifampin -spironolactone This list may not describe all possible interactions. Give your health care provider a list of all the medicines, herbs, non-prescription drugs, or dietary supplements you use. Also tell them if you smoke, drink alcohol, or use illegal drugs. Some items may interact with your medicine. What should I watch for while using this medicine? Visit your doctor or health care professional for regular check-ups. You may need regular tests to make sure your liver is working properly. Tell your doctor or health care professional right away if you get any unexplained muscle pain, tenderness, or weakness, especially if you also have a fever and tiredness. Your doctor or health care professional may tell you to stop taking this medicine if you develop muscle problems. If your muscle problems do not go away after stopping this medicine, contact your health care professional. This drug is only part of a total heart-health program. Your doctor or a dietician can suggest a low-cholesterol and low-fat diet to help. Avoid alcohol and smoking, and keep a proper exercise schedule. Do not use this drug if you are pregnant or breast-feeding. Serious side effects to an unborn child or to an infant are possible. Talk to your doctor or pharmacist for more information. This medicine may affect blood sugar levels. If you have diabetes, check with your doctor or health care professional before you change your diet or the dose of your diabetic medicine. If you are going to have surgery tell your health care professional that you are taking this drug. What side effects may I notice from receiving this medicine? Side effects that you should report to your doctor or health care professional as soon as possible: -allergic reactions like skin  rash, itching or hives, swelling of the face, lips, or tongue -dark urine -fever -joint pain -muscle cramps, pain -redness, blistering, peeling or loosening of the skin, including inside the mouth -trouble passing urine or change in the amount of urine -unusually weak or tired -yellowing of eyes or skin Side effects that usually do not require medical attention (report to your doctor or health care professional if they continue or are bothersome): -constipation -heartburn -stomach gas, pain, upset This list may not describe all possible side effects. Call your doctor for medical advice about side effects. You may report side effects to FDA at 1-800-FDA-1088. Where should I keep my medicine? Keep out of the reach of children. Store at room temperature between 20 to 25 degrees C (68 to 77 degrees F). Throw away any unused medicine after the expiration date. NOTE: This sheet is a summary. It may not cover all possible information. If you have questions about this medicine, talk to your doctor, pharmacist, or health care provider.  2018 Elsevier/Gold Standard (2011-09-14 16:10:96)

## 2018-09-18 NOTE — Addendum Note (Signed)
Addended by: Craige Cotta on: 09/18/2018 04:12 PM   Modules accepted: Orders

## 2018-09-18 NOTE — Progress Notes (Signed)
Cardiology Office Note:    Date:  09/18/2018   ID:  Troy Valentine, DOB [birth year], MRN [id number]  PCP:  Sharlene Dory, DO  Cardiologist:  Garwin Brothers, MD   Referring MD: Shade Flood, MD    ASSESSMENT:    1. History of aortic coarctation repair   2. Essential hypertension   3. Diabetes mellitus due to underlying condition with unspecified complications (HCC)    PLAN:    In order of problems listed above:  1. Primary prevention stressed with the patient.  Importance of compliance with diet and medication stressed and he vocalized understanding.  His blood pressure is stable. 2. Based on the current guidelines I have switched his simvastatin to atorvastatin 20 mg daily will be back in 6 weeks for a liver lipid check. 3. MRA of the chest will be done with angiography in view of coarctation repair status.  I will also get a MRI of the brain to evaluate for the possibility of cerebral aneurysms as patients with aortic coarctation postrepair are prone for aneurysms. 4. We will obtain records from the other cardiology office in terms of office notes and previous echocardiogram and MRA results. 5. Patient will be seen in follow-up appointment in 6 months or earlier if the patient has any concerns    Medication Adjustments/Labs and Tests Ordered: Current medicines are reviewed at length with the patient today.  Concerns regarding medicines are outlined above.  No orders of the defined types were placed in this encounter.  No orders of the defined types were placed in this encounter.    No chief complaint on file.    History of Present Illness:    Troy Valentine is a 57 y.o. male.  Patient has past medical history of repaired coarctation.  He has no problems at this time.  This patient has been under my care in my previous practice.  He is here now to transfer his care and be established with my current practice.  No chest pain orthopnea or PND.  He  is here for evaluation and follow-up of the aortic coarctation repair.  At the time of my evaluation, the patient is alert awake oriented and in no distress.  Past Medical History:  Diagnosis Date  . Allergy   . Diabetes mellitus without complication (HCC)   . Heart murmur   . Hypertension   . Thyroid disease     Past Surgical History:  Procedure Laterality Date  . COARCTATION OF AORTA EXCISION      Current Medications: Current Meds  Medication Sig  . aspirin 81 MG tablet Take 81 mg by mouth daily.  Marland Kitchen levothyroxine (SYNTHROID, LEVOTHROID) 88 MCG tablet Take 1 tab daily. (Patient taking differently: Take 88 mcg by mouth daily before breakfast. Take 1 tab daily.)  . lisinopril (PRINIVIL,ZESTRIL) 20 MG tablet Take 1 tablet (20 mg total) by mouth daily.  . metFORMIN (GLUCOPHAGE-XR) 500 MG 24 hr tablet Take 1 tablet (500 mg total) by mouth daily.  . simvastatin (ZOCOR) 40 MG tablet TAKE 1 TABLET EVERY EVENING     Allergies:   Patient has no known allergies.   Social History   Socioeconomic History  . Marital status: Married    Spouse name: Not on file  . Number of children: Not on file  . Years of education: Not on file  . Highest education level: Not on file  Occupational History  . Not on file  Social Needs  . Financial resource strain: Not  on file  . Food insecurity:    Worry: Not on file    Inability: Not on file  . Transportation needs:    Medical: Not on file    Non-medical: Not on file  Tobacco Use  . Smoking status: Never Smoker  . Smokeless tobacco: Never Used  Substance and Sexual Activity  . Alcohol use: No  . Drug use: No  . Sexual activity: Yes    Birth control/protection: None  Lifestyle  . Physical activity:    Days per week: Not on file    Minutes per session: Not on file  . Stress: Not on file  Relationships  . Social connections:    Talks on phone: Not on file    Gets together: Not on file    Attends religious service: Not on file    Active  member of club or organization: Not on file    Attends meetings of clubs or organizations: Not on file    Relationship status: Not on file  Other Topics Concern  . Not on file  Social History Narrative  . Not on file     Family History: The patient's family history includes Hypertension in his mother; Kidney disease in his mother.  ROS:   Please see the history of present illness.    All other systems reviewed and are negative.  EKGs/Labs/Other Studies Reviewed:    The following studies were reviewed today: I discussed my findings with the patient at length.  EKG reveals sinus rhythm and nonspecific ST-T changes.   Recent Labs: 08/28/2018: ALT 20; BUN 14; Creatinine, Ser 0.97; Hemoglobin 16.5; Platelets 223.0; Potassium 4.2; Sodium 139; TSH 0.59  Recent Lipid Panel    Component Value Date/Time   CHOL 139 08/28/2018 0919   CHOL 144 03/17/2017 1003   TRIG 84.0 08/28/2018 0919   HDL 49.20 08/28/2018 0919   HDL 52 03/17/2017 1003   CHOLHDL 3 08/28/2018 0919   VLDL 16.8 08/28/2018 0919   LDLCALC 73 08/28/2018 0919   LDLCALC 65 03/17/2017 1003    Physical Exam:    VS:  BP 126/68 (BP Location: Right Arm, Patient Position: Sitting, Cuff Size: Normal)   Pulse 86   Ht 5\' 4"  (1.626 m)   Wt 161 lb (73 kg)   SpO2 99%   BMI 27.64 kg/m     Wt Readings from Last 3 Encounters:  09/18/18 161 lb (73 kg)  08/28/18 159 lb 4 oz (72.2 kg)  03/17/17 159 lb (72.1 kg)     GEN: Patient is in no acute distress HEENT: Normal NECK: No JVD; No carotid bruits LYMPHATICS: No lymphadenopathy CARDIAC: Hear sounds regular, 2/6 systolic murmur at the apex. RESPIRATORY:  Clear to auscultation without rales, wheezing or rhonchi  ABDOMEN: Soft, non-tender, non-distended MUSCULOSKELETAL:  No edema; No deformity  SKIN: Warm and dry NEUROLOGIC:  Alert and oriented x 3 PSYCHIATRIC:  Normal affect   Signed, Garwin Brothers, MD  09/18/2018 3:53 PM    [facility]

## 2018-11-02 ENCOUNTER — Ambulatory Visit (HOSPITAL_COMMUNITY)
Admission: RE | Admit: 2018-11-02 | Discharge: 2018-11-02 | Disposition: A | Discharge status: Home / Self Care | Payer: 59 | Source: Ambulatory Visit | Attending: Cardiology | Admitting: Cardiology

## 2018-11-02 DIAGNOSIS — Z8774 Personal history of (corrected) congenital malformations of heart and circulatory system: Secondary | ICD-10-CM | POA: Insufficient documentation

## 2018-11-02 DIAGNOSIS — Z9189 Other specified personal risk factors, not elsewhere classified: Secondary | ICD-10-CM | POA: Insufficient documentation

## 2018-11-02 LAB — CREATININE, SERUM
Creatinine, Ser: 0.89 mg/dL (ref 0.61–1.24)
Creatinine, Ser: 0.97 mg/dL (ref 0.61–1.24)
GFR calc Af Amer: >60 mL/min (ref >60)
GFR calc Af Amer: >60 mL/min (ref >60)

## 2018-11-02 MED ORDER — GADOBUTROL 1 MMOL/ML IV SOLN
8.0000 mL | Freq: Once | INTRAVENOUS | Status: AC | PRN
  Administered 2018-11-02: 8 mL via INTRAVENOUS

## 2018-11-16 ENCOUNTER — Encounter: Payer: Self-pay | Admitting: Family Medicine

## 2019-01-22 IMAGING — MR MR MRA CHEST W/ OR W/O CM
13 series · 16 of 16 positions shown · IV contrast (gadavist)
Comparison: None.

CLINICAL DATA: History of coarctation repair in 0670.

EXAM:
MRA CHEST WITH CONTRAST
TECHNIQUE: Angiographic images of the chest were obtained using MRA technique
with intravenous contrast.
CONTRAST:  8 cc Gadavist

[Series 11: t2_trufi_tra_p2_bh · axial · 8.0mm · 0.62mm/px · 1 of 20 slices shown (1 of 2)]
[im 1/20]
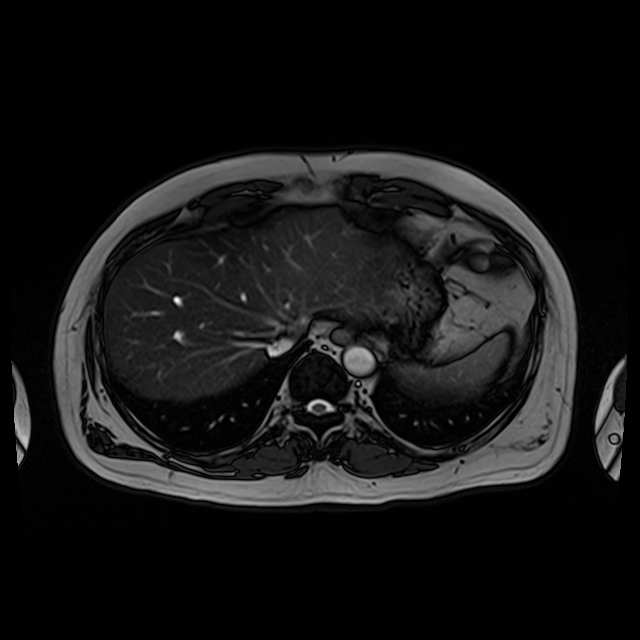

[Series 12: t2_trufi_cor_p2_bh · coronal · 8.0mm · 0.62mm/px · 1 of 20 slices shown]
[im 1/20]
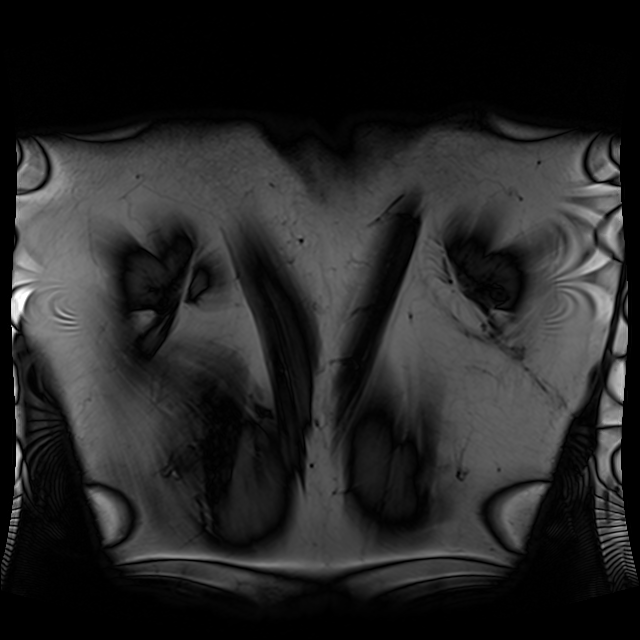

[Series 13: t2_trufi_tra_p2_bh · sagittal · 8.0mm · 0.62mm/px · 1 of 20 slices shown (2 of 2)]
[im 1/20]
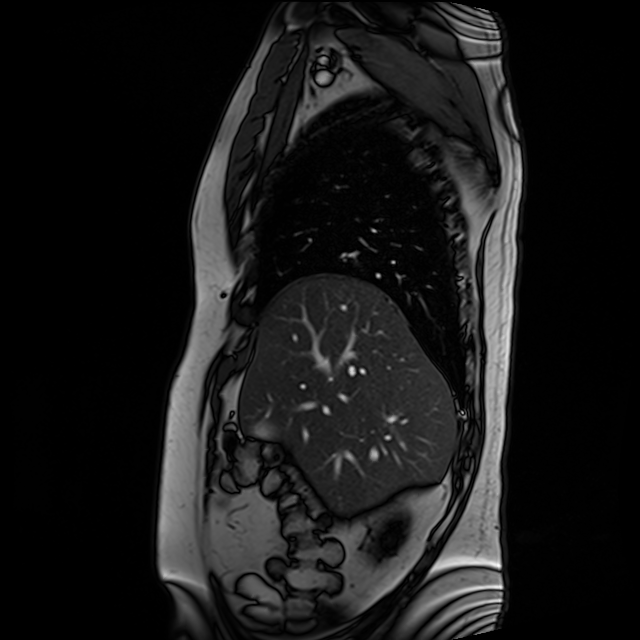

[Series 14: dir str axial · axial · 8.0mm · 1.03mm/px · 1 of 17 slices shown]
[im 1/17]
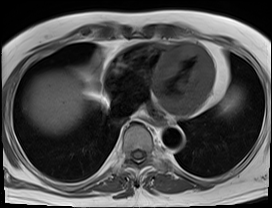

[Series 15: (person_name)_(person_name)_(person_name) · sagittal · 8.0mm · 1.79mm/px · 1 of 125 slices shown]
[im 1/125]
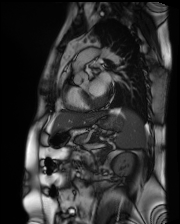

[Series 16: T1 dynamic · axial · non-contrast · 3.3mm · 1.18mm/px · 1 of 80 slices shown]
[im 1/80]
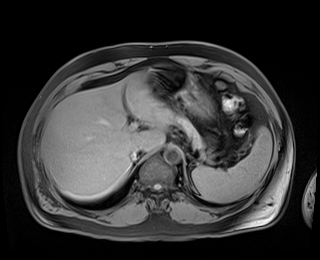

[Series 17: angio_fl3d_sag_pre · sagittal · 1.1mm · 0.99mm/px · 1 of 112 slices shown]
[im 1/112]
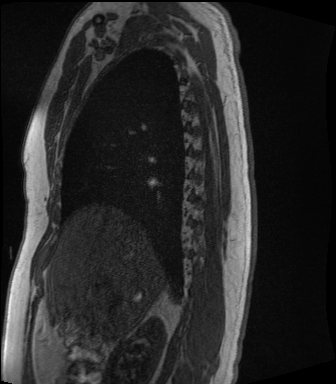

[Series 18: care_bolus_sag · sagittal · 20.0mm · 1.56mm/px · 1 of 41 slices shown]
[im 1/41]
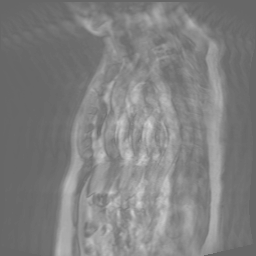

[Series 19: candy cane ce-arterial · sagittal · arterial · 1.1mm · 0.99mm/px · 1 of 112 slices shown]
[im 1/112]
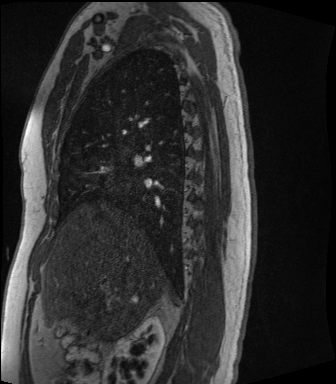

[Series 20: candy cane ce-arterial_sub · sagittal · 1.1mm · 0.99mm/px · 2 of 112 slices shown]
[im 1/112]
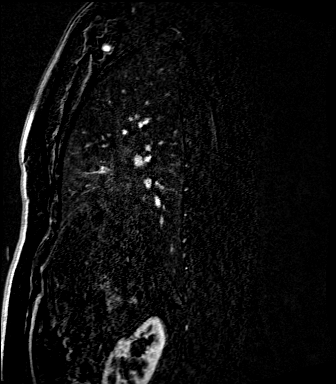
[im 112/112]
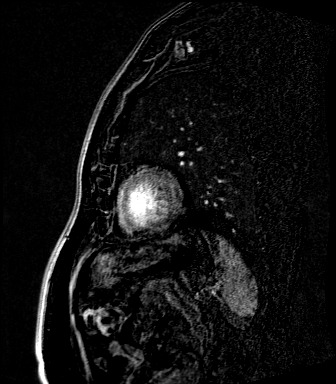

[Series 22: candy cane ce-venous · sagittal · portal-venous · 1.1mm · 0.99mm/px · 2 of 112 slices shown]
[im 1/112]
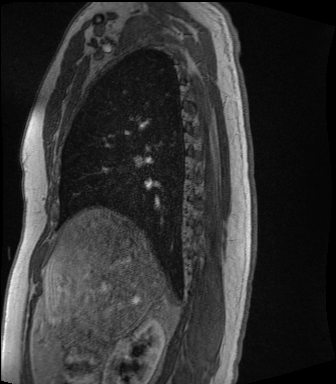
[im 112/112]
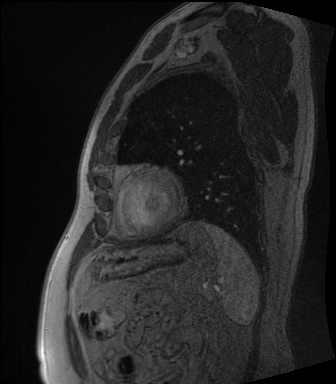

[Series 23: candy cane ce-venous_sub · sagittal · 1.1mm · 0.99mm/px · 2 of 112 slices shown]
[im 1/112]
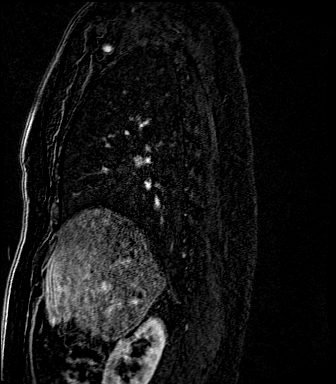
[im 112/112]
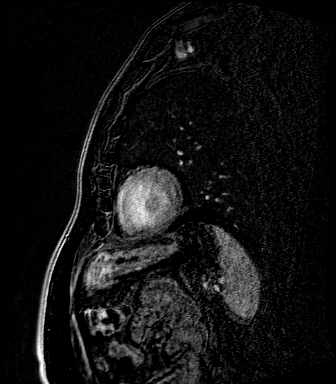

[Series 25: T1 dynamic post-contrast · axial · 3.3mm · 1.18mm/px · 1 of 80 slices shown]
[im 1/80]
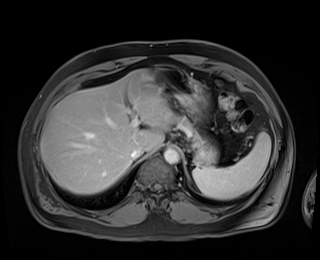

[16 of 16 positions shown; findings below may reference images not displayed]

FINDINGS: Vascular Findings:

No evidence of thoracic aortic aneurysm with measurements as
follows.

Postsurgical change involving the proximal aspect of the descending
thoracic aorta (sagittal image 65, series 19) compatible provided
history of coarctation repair. There is mild focal outpouching
involving the lateral aspect of the proximal descending thoracic
aorta measuring approximately 0.9 x 0.6 cm (image 12, series 3384;
axial image 33, series 25), without associated mural thrombus or
periaortic stranding, also favored to be postsurgical in etiology.
No evidence of thoracic aortic dissection or periaortic stranding on
this nongated examination.

The descending thoracic aorta is mildly tortuous but of normal
caliber. Mild hypertrophy of several intercostal arteries compatible
with history of prior coarctation.

Conventional configuration of the aortic arch. The branch vessels of
the aortic arch appear widely patent throughout their imaged
courses.

Borderline cardiomegaly.  No pericardial effusion.

No evidence of thoracic aortic aneurysm that Although this
examination was not tailored for the evaluation the pulmonary
arteries, there are no discrete filling defects within the central
pulmonary arterial tree to suggest central pulmonary embolism.
Normal caliber of the main pulmonary artery.

-------------------------------------------------------------

Thoracic aortic measurements:

Sinotubular junction

27 mm as measured in greatest oblique coronal dimension.

Proximal ascending aorta

30 mm as measured in greatest oblique axial dimension at the level
of the main pulmonary artery.

Aortic arch aorta

21 mm as measured in greatest oblique sagittal dimension.

Proximal descending thoracic aorta

22 mm as measured in greatest oblique axial dimension at the level
of the main pulmonary artery and approximately 24 mm in greatest
oblique short axis sagittal diameter (image 60, series 19).

Distal descending thoracic aorta

20 mm as measured in greatest oblique sagittal dimension at the
level of the diaphragmatic hiatus (image 56, series 19).

Review of the MIP images confirms the above findings.

-------------------------------------------------------------

Non-Vascular Findings:

Mediastinum/Lymph Nodes: No bulky mediastinal, hilar or axillary
lymphadenopathy.

Lungs/Pleura: Minimal dependent subsegmental atelectasis within the
bilateral lower lobes, right greater than left. No discrete focal
airspace opacities. No pleural effusion.

Upper abdomen: Limited early arterial phase evaluation of the upper
abdomen is normal.

Musculoskeletal: No definite acute or aggressive osseous
abnormalities.
IMPRESSION: 1. Postsurgical change of the proximal descending thoracic aortic,
compatible with provided history of coarctation repair, without
evidence of complication.
2. No evidence of thoracic aortic aneurysm or dissection.

## 2019-02-28 ENCOUNTER — Ambulatory Visit (INDEPENDENT_AMBULATORY_CARE_PROVIDER_SITE_OTHER): Payer: 59 | Admitting: Family Medicine

## 2019-02-28 ENCOUNTER — Encounter: Payer: Self-pay | Admitting: Family Medicine

## 2019-02-28 ENCOUNTER — Other Ambulatory Visit: Payer: Self-pay

## 2019-02-28 DIAGNOSIS — I10 Essential (primary) hypertension: Secondary | ICD-10-CM

## 2019-02-28 DIAGNOSIS — E119 Type 2 diabetes mellitus without complications: Secondary | ICD-10-CM

## 2019-02-28 DIAGNOSIS — E038 Other specified hypothyroidism: Secondary | ICD-10-CM | POA: Diagnosis not present

## 2019-02-28 MED ORDER — LEVOTHYROXINE SODIUM 88 MCG PO TABS: 88.0000 ug | ORAL_TABLET | Freq: Every day | ORAL | 2 refills | Status: DC

## 2019-02-28 MED ORDER — METFORMIN HCL ER 500 MG PO TB24: 500.0000 mg | ORAL_TABLET | Freq: Every day | ORAL | 2 refills | Status: DC

## 2019-02-28 MED ORDER — LISINOPRIL 20 MG PO TABS: 20.0000 mg | ORAL_TABLET | Freq: Every day | ORAL | 2 refills | Status: DC

## 2019-02-28 NOTE — Progress Notes (Signed)
Subjective:   Chief Complaint  Patient presents with  . Follow-up    Troy Valentine is a 58 y.o. male here for follow-up of diabetes.   Troy Valentine  Does not monitor home sugars. Patient does not require insulin.   Medications include: Metformin XR 500 mg/d. Exercise: walking Diet is very healthy Eye exam was 09/2018  Hypertension Patient presents for hypertension follow up. He does monitor home blood pressures. Blood pressures ranging on average from 120's/70's. He is compliant with medication- lisinopril 20 mg/d. Patient has these side effects of medication: none He is adhering to a healthy diet overall. Exercise: walking  Hypothyroidism Patient presents for follow-up of hypothyroidism.  Reports compliance with medication. Current symptoms include: denies fatigue, weight changes, heat/cold intolerance, bowel/skin changes or CVS symptoms He believes his dose should be unchanged  Past Medical History:  Diagnosis Date  . Allergy   . Diabetes mellitus without complication (HCC)   . Heart murmur   . Hypertension   . Thyroid disease      Related testing: Date of retinal exam: Done Pneumovax: done Flu Shot: done  Review of Systems: Pulmonary:  No SOB Cardiovascular:  No chest pain  Objective:  Due to outbreak, we are interacting via web portal for an electronic face-to-face visit. I verified patient's ID using 2 identifiers.    Assessment:   Type 2 diabetes mellitus without complication, without long-term current use of insulin (HCC) - Plan: metFORMIN (GLUCOPHAGE-XR) 500 MG 24 hr tablet, Hemoglobin A1c  Essential hypertension - Plan: lisinopril (ZESTRIL) 20 MG tablet  Other specified hypothyroidism - Plan: levothyroxine (SYNTHROID) 88 MCG tablet   Plan:   #1- ck A1c, cont Metformin. Counseled on diet and exercise. #2- OK to stop checking BP. Cont ACEi.  #3- Cont Synthroid. Waiting on colonoscopy due to COVID-19 crisis.  F/u in 6 mo for CPE. The  patient voiced understanding and agreement to the plan.  Jilda Roche [place], DO 02/28/19 9:31 AM

## 2019-03-02 ENCOUNTER — Other Ambulatory Visit (INDEPENDENT_AMBULATORY_CARE_PROVIDER_SITE_OTHER): Payer: 59

## 2019-03-02 ENCOUNTER — Other Ambulatory Visit: Payer: Self-pay

## 2019-03-02 DIAGNOSIS — E119 Type 2 diabetes mellitus without complications: Secondary | ICD-10-CM

## 2019-03-02 LAB — HEMOGLOBIN A1C: Hgb A1c MFr Bld: 6.3 % (ref 4.6–6.5)

## 2019-03-03 ENCOUNTER — Other Ambulatory Visit: Payer: Self-pay

## 2019-03-05 ENCOUNTER — Other Ambulatory Visit: Payer: Self-pay | Admitting: Family Medicine

## 2019-03-05 ENCOUNTER — Telehealth: Payer: Self-pay

## 2019-03-05 DIAGNOSIS — I10 Essential (primary) hypertension: Secondary | ICD-10-CM

## 2019-03-05 MED ORDER — ATORVASTATIN CALCIUM 20 MG PO TABS: 20.0000 mg | ORAL_TABLET | Freq: Every day | ORAL | 0 refills | Status: DC

## 2019-03-05 NOTE — Telephone Encounter (Signed)
Received refill request for lipitor 90 day supply sent with note to advise patient to call office and schedule May 2020 f/u

## 2019-03-05 NOTE — Telephone Encounter (Signed)
Copied from CRM [id number]. Topic: Quick Communication - Rx Refill/Question >> Mar 05, 2019  4:29 PM Wyonia Hough E wrote: Medication: The pharmacy received a request for lisinopril (ZESTRIL) 20 MG tablet and one for lisinopril 10 MG. Pharmacy needs clarity on which one is the correct dose and Rx for the Pt/ please advise   Has the patient contacted their pharmacy?  Yes  Preferred Pharmacy (with phone number or street name): [facility] [place], [place] - [address] AT Portal to [facility] [phone] (Phone) [phone] (Fax)    Agent: Please be advised that RX refills may take up to 3 business days. We ask that you follow-up with your pharmacy.

## 2019-03-06 MED ORDER — LISINOPRIL 20 MG PO TABS: 20.0000 mg | ORAL_TABLET | Freq: Every day | ORAL | 2 refills | Status: DC

## 2019-03-06 NOTE — Telephone Encounter (Signed)
Lisinopril 20 on current list

## 2019-07-05 NOTE — Addendum Note (Signed)
Addended by: Beckey Rutter on: 07/05/2019 03:17 PM   Modules accepted: Orders

## 2019-07-19 LAB — HEPATIC FUNCTION PANEL
ALT: 24 IU/L (ref 0–44)
AST: 18 IU/L (ref 0–40)
Albumin: 4.6 g/dL (ref 3.8–4.9)
Alkaline Phosphatase: 65 IU/L (ref 39–117)
Bilirubin Total: 0.4 mg/dL (ref 0.0–1.2)
Bilirubin, Direct: 0.15 mg/dL (ref 0.00–0.40)
Total Protein: 6.9 g/dL (ref 6.0–8.5)

## 2019-07-19 LAB — LIPID PANEL
Chol/HDL Ratio: 4.9 ratio (ref 0.0–5.0)
Cholesterol, Total: 242 mg/dL — ABNORMAL HIGH (ref 100–199)
HDL: 49 mg/dL (ref >39)
LDL Chol Calc (NIH): 151 mg/dL — ABNORMAL HIGH (ref 0–99)
Triglycerides: 233 mg/dL — ABNORMAL HIGH (ref 0–149)
VLDL Cholesterol Cal: 42 mg/dL — ABNORMAL HIGH (ref 5–40)

## 2019-07-23 ENCOUNTER — Telehealth: Payer: Self-pay

## 2019-07-23 MED ORDER — ATORVASTATIN CALCIUM 20 MG PO TABS: 20.0000 mg | ORAL_TABLET | Freq: Every day | ORAL | 1 refills | Status: DC

## 2019-07-23 NOTE — Telephone Encounter (Signed)
Per Dr. Docia Furl request refill for [facility]atin [facility].

## 2019-07-23 NOTE — Telephone Encounter (Signed)
-----   Message from Jenean Lindau, MD sent at 07/22/2019  6:29 PM EDT ----- I spoke to him .Marland KitchenMarland Kitchenpl call him and refill existing statin for 90 days. ----- Message ----- From: Lavone Neri Lab Results In Sent: 07/19/2019   5:39 AM EDT To: Jenean Lindau, MD

## 2019-08-31 ENCOUNTER — Ambulatory Visit (INDEPENDENT_AMBULATORY_CARE_PROVIDER_SITE_OTHER): Payer: 59 | Admitting: Family Medicine

## 2019-08-31 ENCOUNTER — Other Ambulatory Visit: Payer: Self-pay

## 2019-08-31 ENCOUNTER — Encounter: Payer: Self-pay | Admitting: Family Medicine

## 2019-08-31 VITALS — BP 120/84 | HR 83 | Temp 96.0°F | Ht 64.0 in | Wt 157.5 lb

## 2019-08-31 DIAGNOSIS — Z0000 Encounter for general adult medical examination without abnormal findings: Secondary | ICD-10-CM

## 2019-08-31 DIAGNOSIS — E088 Diabetes mellitus due to underlying condition with unspecified complications: Secondary | ICD-10-CM

## 2019-08-31 DIAGNOSIS — Z23 Encounter for immunization: Secondary | ICD-10-CM | POA: Diagnosis not present

## 2019-08-31 DIAGNOSIS — E038 Other specified hypothyroidism: Secondary | ICD-10-CM | POA: Diagnosis not present

## 2019-08-31 DIAGNOSIS — I10 Essential (primary) hypertension: Secondary | ICD-10-CM

## 2019-08-31 DIAGNOSIS — E119 Type 2 diabetes mellitus without complications: Secondary | ICD-10-CM

## 2019-08-31 LAB — HEMOGLOBIN A1C: Hgb A1c MFr Bld: 5.9 % (ref 4.6–6.5)

## 2019-08-31 LAB — COMPREHENSIVE METABOLIC PANEL
ALT: 22 U/L (ref 0–53)
AST: 17 U/L (ref 0–37)
Albumin: 4.4 g/dL (ref 3.5–5.2)
Alkaline Phosphatase: 59 U/L (ref 39–117)
BUN: 11 mg/dL (ref 6–23)
CO2: 29 mEq/L (ref 19–32)
Calcium: 9.1 mg/dL (ref 8.4–10.5)
Chloride: 102 mEq/L (ref 96–112)
Creatinine, Ser: 0.9 mg/dL (ref 0.40–1.50)
GFR: 87 mL/min (ref >60.00)
Glucose, Bld: 119 mg/dL — ABNORMAL HIGH (ref 70–99)
Potassium: 4.1 mEq/L (ref 3.5–5.1)
Sodium: 139 mEq/L (ref 135–145)
Total Bilirubin: 1.1 mg/dL (ref 0.2–1.2)
Total Protein: 6.6 g/dL (ref 6.0–8.3)

## 2019-08-31 LAB — MICROALBUMIN / CREATININE URINE RATIO
Creatinine,U: 175.9 mg/dL
Microalb Creat Ratio: 0.4 mg/g (ref 0.0–30.0)
Microalb, Ur: <0.7 mg/dL (ref 0.0–1.9)

## 2019-08-31 LAB — CBC
HCT: 44 % (ref 39.0–52.0)
Hemoglobin: 15.3 g/dL (ref 13.0–17.0)
MCHC: 34.6 g/dL (ref 30.0–36.0)
MCV: 85.7 fl (ref 78.0–100.0)
Platelets: 218 10*3/uL (ref 150.0–400.0)
RBC: 5.14 Mil/uL (ref 4.22–5.81)
RDW: 13.3 % (ref 11.5–15.5)
WBC: 7.8 10*3/uL (ref 4.0–10.5)

## 2019-08-31 LAB — LIPID PANEL
Cholesterol: 142 mg/dL (ref 0–200)
HDL: 47.7 mg/dL (ref >39.00)
LDL Cholesterol: 71 mg/dL (ref 0–99)
NonHDL: 94.63
Total CHOL/HDL Ratio: 3
Triglycerides: 118 mg/dL (ref 0.0–149.0)
VLDL: 23.6 mg/dL (ref 0.0–40.0)

## 2019-08-31 LAB — TSH: TSH: 0.36 u[IU]/mL (ref 0.35–4.50)

## 2019-08-31 LAB — T4, FREE: Free T4: 1.09 ng/dL (ref 0.60–1.60)

## 2019-08-31 MED ORDER — METFORMIN HCL ER 500 MG PO TB24: 500.0000 mg | ORAL_TABLET | Freq: Every day | ORAL | 2 refills | Status: DC

## 2019-08-31 MED ORDER — LISINOPRIL 20 MG PO TABS: 20.0000 mg | ORAL_TABLET | Freq: Every day | ORAL | 2 refills | Status: DC

## 2019-08-31 MED ORDER — LEVOTHYROXINE SODIUM 88 MCG PO TABS: 88.0000 ug | ORAL_TABLET | Freq: Every day | ORAL | 2 refills | Status: DC

## 2019-08-31 NOTE — Progress Notes (Addendum)
Chief Complaint  Patient presents with  . Annual Exam    Well Male Troy Valentine is here for a complete physical.   His last physical was >1 year ago.  Current diet: in general, a "healthy" diet.  Current exercise: walking Weight trend: has lost a few lbs Daytime fatigue? No. Seat belt? Yes.    Health maintenance Shingrix- No Colonoscopy- No Tetanus- Yes HIV- Yes Hep C- Yes   Past Medical History:  Diagnosis Date  . Allergy   . Diabetes mellitus without complication ([place])   . Heart murmur   . Hypertension   . Thyroid disease       Past Surgical History:  Procedure Laterality Date  . COARCTATION OF AORTA EXCISION      Medications  Current Outpatient Medications on File Prior to Visit  Medication Sig Dispense Refill  . aspirin 81 MG tablet Take 81 mg by mouth daily.    Marland Kitchen atorvastatin (LIPITOR) 20 MG tablet Take 1 tablet (20 mg total) by mouth daily. 90 tablet 1  . levothyroxine (SYNTHROID) 88 MCG tablet Take 1 tablet (88 mcg total) by mouth daily before breakfast. Take 1 tab daily. 90 tablet 2  . lisinopril (ZESTRIL) 20 MG tablet Take 1 tablet (20 mg total) by mouth daily. 90 tablet 2  . metFORMIN (GLUCOPHAGE-XR) 500 MG 24 hr tablet Take 1 tablet (500 mg total) by mouth daily. 90 tablet 2    Allergies No Known Allergies  Family History Family History  Problem Relation Age of Onset  . Hypertension Mother   . Kidney disease Mother     Review of Systems: Constitutional:  no fevers Eye:  no recent significant change in vision Ear/Nose/Mouth/Throat:  Ears:  no hearing loss Nose/Mouth/Throat:  no complaints of nasal congestion, no sore throat Cardiovascular:  no chest pain, no palpitations Respiratory:  no cough and no shortness of breath Gastrointestinal:  no abdominal pain, no change in bowel habits GU:  Male: negative for dysuria, frequency, and incontinence and negative for prostate symptoms Musculoskeletal/Extremities:  no pain, redness, or swelling  of the joints Integumentary (Skin/Breast):  no abnormal skin lesions reported Neurologic:  no headaches Endocrine: No unexpected weight changes Hematologic/Lymphatic:  no abnormal bleeding  Exam BP 120/84 (BP Location: Left Arm, Patient Position: Sitting, Cuff Size: Normal)   Pulse 83   Temp (!) 96 F (35.6 C) (Temporal)   Ht 5\' 4"  (1.626 m)   Wt 157 lb 8 oz (71.4 kg)   SpO2 99%   BMI 27.03 kg/m  General:  well developed, well nourished, in no apparent distress Skin:  no significant moles, warts, or growths Head:  no masses, lesions, or tenderness Eyes:  pupils equal and round, sclera anicteric without injection Ears:  canals without lesions, TMs shiny without retraction, no obvious effusion, no erythema Nose:  nares patent, septum midline, mucosa normal Throat/Pharynx:  lips and gingiva without lesion; tongue and uvula midline; non-inflamed pharynx; no exudates or postnasal drainage Neck: neck supple without adenopathy, thyromegaly, or masses Lungs:  clear to auscultation, breath sounds equal bilaterally, no respiratory distress Cardio:  regular rate and rhythm, no LE edema, no bruits Abdomen:  abdomen soft, nontender; bowel sounds normal; no masses or organomegaly Rectal: Deferred Musculoskeletal: +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; otherwise symmetrical muscle groups noted without atrophy or deformity Extremities:  no clubbing, cyanosis, or edema, no deformities, no skin discoloration Neuro: Sensation intact to pinprick on feet b/l; gait normal; deep tendon reflexes normal and symmetric Psych:  well oriented with normal range of affect and appropriate judgment/insight  Assessment and Plan  Well adult exam - Plan: CBC, Comprehensive metabolic panel, Lipid panel, TSH, T4, free  Need for influenza vaccination - Plan: Flu Vaccine QUAD 6+ mos PF IM (Fluarix Quad PF)  Diabetes mellitus due to underlying condition with unspecified complications (HCC) - Plan: Hemoglobin A1c,  Microalbumin / creatinine urine ratio, metFORMIN (GLUCOPHAGE-XR) 500 MG 24 hr tablet  Other specified hypothyroidism - Plan: levothyroxine (SYNTHROID) 88 MCG tablet  Essential hypertension - Plan: lisinopril (ZESTRIL) 20 MG tablet   Well 58 y.o. male. Counseled on diet and exercise. Counseled on risks and benefits of prostate cancer screening with PSA. The patient agrees to forego testing. Immunizations, labs, and further orders as above. Follow up in 6 mo. The patient voiced understanding and agreement to the plan.  Jilda Roche [place], DO 09/05/19 3:00 PM

## 2019-08-31 NOTE — Patient Instructions (Addendum)
Give [name] 2-3 business days to get the results of your labs back.   Keep the diet clean and stay active.  Let me know when you are interested in getting a colonoscopy, send me a message.    The new Shingrix vaccine (for shingles) is a 2 shot series. It can make people feel low energy, achy and almost like they have the flu for 48 hours after injection. Please plan accordingly when deciding on when to get this shot. Call our office for a nurse visit appointment to get this. The second shot of the series is less severe regarding the side effects, but it still lasts 48 hours.   Let us know if you need anything.

## 2019-10-31 ENCOUNTER — Telehealth: Payer: Self-pay | Admitting: *Deleted

## 2019-10-31 MED ORDER — ATORVASTATIN CALCIUM 20 MG PO TABS: 20.0000 mg | ORAL_TABLET | Freq: Every day | ORAL | 0 refills | Status: DC

## 2019-10-31 NOTE — Addendum Note (Signed)
Addended by: Beckey Rutter on: 10/31/2019 05:24 PM   Modules accepted: Orders

## 2019-10-31 NOTE — Telephone Encounter (Signed)
*  STAT* If patient is at the pharmacy, call can be transferred to refill team.   1. Which medications need to be refilled? (please list name of each medication and dose if known) Lipitor 20 mg  2. Which pharmacy/location (including street and city if local pharmacy) is medication [facility]   3. Do they need a 30 day or 90 day supply? [place]

## 2020-01-08 ENCOUNTER — Other Ambulatory Visit: Payer: Self-pay

## 2020-01-08 ENCOUNTER — Encounter: Payer: Self-pay | Admitting: Cardiology

## 2020-01-08 ENCOUNTER — Ambulatory Visit (INDEPENDENT_AMBULATORY_CARE_PROVIDER_SITE_OTHER): Payer: 59 | Admitting: Cardiology

## 2020-01-08 VITALS — BP 136/88 | HR 71 | Ht 64.0 in | Wt 157.0 lb

## 2020-01-08 DIAGNOSIS — I10 Essential (primary) hypertension: Secondary | ICD-10-CM

## 2020-01-08 DIAGNOSIS — E088 Diabetes mellitus due to underlying condition with unspecified complications: Secondary | ICD-10-CM

## 2020-01-08 DIAGNOSIS — E785 Hyperlipidemia, unspecified: Secondary | ICD-10-CM | POA: Diagnosis not present

## 2020-01-08 DIAGNOSIS — Z8774 Personal history of (corrected) congenital malformations of heart and circulatory system: Secondary | ICD-10-CM | POA: Diagnosis not present

## 2020-01-08 NOTE — Progress Notes (Signed)
Cardiology Office Note:    Date:  01/08/2020   ID:  Troy Valentine, DOB 08-16-1961, MRN [id number]  PCP:  Sharlene Dory, DO  Cardiologist:  Garwin Brothers, MD   Referring MD: Sharlene Dory*    ASSESSMENT:    1. History of aortic coarctation repair   2. Diabetes mellitus due to underlying condition with unspecified complications (HCC)   3. Essential hypertension   4. Hyperlipidemia LDL goal <100    PLAN:    In order of problems listed above:  1. Primary prevention stressed with the patient.  Importance of compliance with diet and medication stressed and he vocalized understanding. 2. Essential hypertension: Blood pressure is stable.  Diet was discussed 3. Mixed dyslipidemia and diabetes mellitus: Lipids were reviewed.  Diet was discussed.  This is managed by his primary care physician.  I reviewed the numbers and found them to be stable 4. Hypothyroidism: He mentions to me that his heart rates generally on the faster side.  He will keep a track of this on a regular basis.  He may consider decreasing his dose of thyroid medication and see its effect on heart rate.  He will discuss this with his primary care physician at length.Patient will be seen in follow-up appointment in 6 months or earlier if the patient has any concerns   Medication Adjustments/Labs and Tests Ordered: Current medicines are reviewed at length with the patient today.  Concerns regarding medicines are outlined above.  No orders of the defined types were placed in this encounter.  No orders of the defined types were placed in this encounter.    Chief Complaint  Patient presents with  . Follow-up    6 Months     History of Present Illness:    Troy Valentine is a 59 y.o. male.  Patient has past medical history of coarctation of aorta repair, essential hypertension, diabetes mellitus and dyslipidemia.  He denies any problems at this time and takes care of activities of daily  living.  No chest pain orthopnea or PND.  He exercises on a regular basis without symptoms.  He is not very regular with exercise but plans to do so.  At the time of my evaluation, the patient is alert awake oriented and in no distress.  At the time of my evaluation, the patient is alert awake oriented and in no distress.  Past Medical History:  Diagnosis Date  . Allergy   . Diabetes mellitus without complication (HCC)   . Heart murmur   . Hypertension   . Thyroid disease     Past Surgical History:  Procedure Laterality Date  . COARCTATION OF AORTA EXCISION      Current Medications: Current Meds  Medication Sig  . aspirin 81 MG tablet Take 81 mg by mouth daily.  Marland Kitchen atorvastatin (LIPITOR) 20 MG tablet Take 1 tablet (20 mg total) by mouth daily. Please advise patient to call office and schedule f/u appt for future refills  . levothyroxine (SYNTHROID) 88 MCG tablet Take 1 tablet (88 mcg total) by mouth daily before breakfast. Take 1 tab daily.  Marland Kitchen lisinopril (ZESTRIL) 20 MG tablet Take 1 tablet (20 mg total) by mouth daily.  . metFORMIN (GLUCOPHAGE-XR) 500 MG 24 hr tablet Take 1 tablet (500 mg total) by mouth daily.     Allergies:   Patient has no known allergies.   Social History   Socioeconomic History  . Marital status: Married    Spouse name: Not on file  .  Number of children: Not on file  . Years of education: Not on file  . Highest education level: Not on file  Occupational History  . Not on file  Tobacco Use  . Smoking status: Never Smoker  . Smokeless tobacco: Never Used  Substance and Sexual Activity  . Alcohol use: No  . Drug use: No  . Sexual activity: Yes    Birth control/protection: None  Other Topics Concern  . Not on file  Social History Narrative  . Not on file   Social Determinants of Health   Financial Resource Strain:   . Difficulty of Paying Living Expenses: Not on file  Food Insecurity:   . Worried About Charity fundraiser in the Last Year: Not  on file  . Ran Out of Food in the Last Year: Not on file  Transportation Needs:   . Lack of Transportation (Medical): Not on file  . Lack of Transportation (Non-Medical): Not on file  Physical Activity:   . Days of Exercise per Week: Not on file  . Minutes of Exercise per Session: Not on file  Stress:   . Feeling of Stress : Not on file  Social Connections:   . Frequency of Communication with Friends and Family: Not on file  . Frequency of Social Gatherings with Friends and Family: Not on file  . Attends Religious Services: Not on file  . Active Member of Clubs or Organizations: Not on file  . Attends Archivist Meetings: Not on file  . Marital Status: Not on file     Family History: The patient's family history includes Hypertension in his mother; Kidney disease in his mother.  ROS:   Please see the history of present illness.    All other systems reviewed and are negative.  EKGs/Labs/Other Studies Reviewed:    The following studies were reviewed today: I discussed my findings with the patient at extensive length.   Recent Labs: 08/31/2019: ALT 22; BUN 11; Creatinine, Ser 0.90; Hemoglobin 15.3; Platelets 218.0; Potassium 4.1; Sodium 139; TSH 0.36  Recent Lipid Panel    Component Value Date/Time   CHOL 142 08/31/2019 0833   CHOL 242 (H) 07/18/2019 0811   TRIG 118.0 08/31/2019 0833   HDL 47.70 08/31/2019 0833   HDL 49 07/18/2019 0811   CHOLHDL 3 08/31/2019 0833   VLDL 23.6 08/31/2019 0833   LDLCALC 71 08/31/2019 0833   LDLCALC 151 (H) 07/18/2019 0811    Physical Exam:    VS:  BP 136/88   Pulse 71   Ht 5\' 4"  (1.626 m)   Wt 157 lb (71.2 kg)   SpO2 98%   BMI 26.95 kg/m     Wt Readings from Last 3 Encounters:  01/08/20 157 lb (71.2 kg)  08/31/19 157 lb 8 oz (71.4 kg)  09/18/18 161 lb (73 kg)     GEN: Patient is in no acute distress HEENT: Normal NECK: No JVD; No carotid bruits LYMPHATICS: No lymphadenopathy CARDIAC: Hear sounds regular, 2/6  systolic murmur at the apex. RESPIRATORY:  Clear to auscultation without rales, wheezing or rhonchi  ABDOMEN: Soft, non-tender, non-distended MUSCULOSKELETAL:  No edema; No deformity  SKIN: Warm and dry NEUROLOGIC:  Alert and oriented x 3 PSYCHIATRIC:  Normal affect   Signed, Jenean Lindau, MD  01/08/2020 4:02 PM     [facility]

## 2020-01-08 NOTE — Patient Instructions (Signed)

## 2020-01-28 ENCOUNTER — Other Ambulatory Visit: Payer: Self-pay

## 2020-01-28 MED ORDER — ATORVASTATIN CALCIUM 20 MG PO TABS: 20.0000 mg | ORAL_TABLET | Freq: Every day | ORAL | 3 refills | Status: DC

## 2020-01-28 MED ORDER — ATORVASTATIN CALCIUM 20 MG PO TABS: 20.0000 mg | ORAL_TABLET | Freq: Every day | ORAL | 0 refills | Status: DC

## 2020-02-28 ENCOUNTER — Other Ambulatory Visit: Payer: Self-pay

## 2020-02-29 ENCOUNTER — Encounter: Payer: Self-pay | Admitting: Family Medicine

## 2020-02-29 ENCOUNTER — Ambulatory Visit (INDEPENDENT_AMBULATORY_CARE_PROVIDER_SITE_OTHER): Payer: 59 | Admitting: Family Medicine

## 2020-02-29 VITALS — BP 112/72 | HR 72 | Temp 95.6°F | Ht 64.0 in | Wt 159.5 lb

## 2020-02-29 DIAGNOSIS — E1165 Type 2 diabetes mellitus with hyperglycemia: Secondary | ICD-10-CM

## 2020-02-29 DIAGNOSIS — E039 Hypothyroidism, unspecified: Secondary | ICD-10-CM

## 2020-02-29 DIAGNOSIS — T7840XA Allergy, unspecified, initial encounter: Secondary | ICD-10-CM | POA: Diagnosis not present

## 2020-02-29 LAB — COMPREHENSIVE METABOLIC PANEL
ALT: 19 U/L (ref 0–53)
AST: 15 U/L (ref 0–37)
Albumin: 4.3 g/dL (ref 3.5–5.2)
Alkaline Phosphatase: 61 U/L (ref 39–117)
BUN: 12 mg/dL (ref 6–23)
CO2: 28 mEq/L (ref 19–32)
Calcium: 9.2 mg/dL (ref 8.4–10.5)
Chloride: 104 mEq/L (ref 96–112)
Creatinine, Ser: 0.93 mg/dL (ref 0.40–1.50)
GFR: 83.62 mL/min (ref >60.00)
Glucose, Bld: 125 mg/dL — ABNORMAL HIGH (ref 70–99)
Potassium: 4.3 mEq/L (ref 3.5–5.1)
Sodium: 140 mEq/L (ref 135–145)
Total Bilirubin: 0.8 mg/dL (ref 0.2–1.2)
Total Protein: 6.5 g/dL (ref 6.0–8.3)

## 2020-02-29 LAB — LIPID PANEL
Cholesterol: 143 mg/dL (ref 0–200)
HDL: 44.6 mg/dL (ref >39.00)
LDL Cholesterol: 78 mg/dL (ref 0–99)
NonHDL: 98.37
Total CHOL/HDL Ratio: 3
Triglycerides: 102 mg/dL (ref 0.0–149.0)
VLDL: 20.4 mg/dL (ref 0.0–40.0)

## 2020-02-29 LAB — TSH: TSH: 1.05 u[IU]/mL (ref 0.35–4.50)

## 2020-02-29 LAB — HEMOGLOBIN A1C: Hgb A1c MFr Bld: 6 % (ref 4.6–6.5)

## 2020-02-29 NOTE — Patient Instructions (Addendum)
Give [name] 2-3 business days to get the results of your labs back.   Keep the diet clean and stay active.  Consider getting an eye exam. Consider doing the first appointment in the day for them.   The only lifestyle changes that have data behind them are weight loss for the overweight/obese and elevating the head of the bed. Finding out which foods/positions are triggers is important.  Consider Pepcid 20-40 mg before greasy foods.   Let us know if you need anything.

## 2020-02-29 NOTE — Progress Notes (Signed)
Subjective:   Chief Complaint  Patient presents with  . Follow-up    Troy Valentine is a 59 y.o. male here for follow-up of diabetes.   [name] does not monitor his sugars. Patient does not require insulin.   Medications include: Metformin XR 500 mg/d Diet is good. Has lost a few lbs. Exercise:   Hypertension Patient presents for hypertension follow up. He does monitor home blood pressures. Blood pressures ranging on average from 110-120's/70's. He is compliant with medication- lisinopril 20 mg/d. Patient has these side effects of medication: none  +hx of egg intolerance where he will getting itching and GI upset. OK w baked goods with eggs.   Past Medical History:  Diagnosis Date  . Allergy   . Diabetes mellitus without complication (HCC)   . Heart murmur   . Hypertension   . Thyroid disease     Related testing: Date of retinal exam: Due Pneumovax: done  Objective:  BP 112/72 (BP Location: Left Arm, Patient Position: Sitting, Cuff Size: Normal)   Pulse 72   Temp (!) 95.6 F (35.3 C) (Temporal)   Ht 5\' 4"  (1.626 m)   Wt 159 lb 8 oz (72.3 kg)   SpO2 97%   BMI 27.38 kg/m  General:  Well developed, well nourished, in no apparent distress Skin:  Warm, no pallor or diaphoresis Head:  Normocephalic, atraumatic Eyes:  Pupils equal and round, sclera anicteric without injection  Lungs:  CTAB, no access msc use Cardio:  RRR, no bruits, no LE edema Psych: Age appropriate judgment and insight  Assessment:   Type 2 diabetes mellitus with hyperglycemia, without long-term current use of insulin (HCC) - Plan: Hemoglobin A1c, Comprehensive metabolic panel, Lipid panel  Allergy, initial encounter - Plan: Food Allergy Profile  Hypothyroidism, unspecified type - Plan: TSH   Plan:   1- Ck labs. Counseled on diet and exercise. 2- Ck egg allergy profile.  3- Ck labs. Cont Synthroid. F/u in 6 mo for CPE or prn. The patient voiced understanding and agreement to the  plan.  [place], DO 02/29/20 7:45 AM

## 2020-03-03 LAB — FOOD ALLERGY PROFILE
Allergen, Salmon, f41: <0.1 kU/L
Almonds: <0.1 kU/L
CLASS: 0
CLASS: 0
CLASS: 0
CLASS: 0
CLASS: 0
CLASS: 0
CLASS: 0
CLASS: 0
CLASS: 0
CLASS: 0
CLASS: 0
Cashew IgE: <0.1 kU/L
Class: 0
Class: 0
Class: 0
Egg White IgE: <0.1 kU/L
Fish Cod: <0.1 kU/L
Hazelnut: <0.1 kU/L
Milk IgE: 0.13 kU/L — ABNORMAL HIGH
Peanut IgE: <0.1 kU/L
Scallop IgE: <0.1 kU/L
Sesame Seed f10: <0.1 kU/L
Shrimp IgE: <0.1 kU/L
Soybean IgE: <0.1 kU/L
Tuna IgE: <0.1 kU/L
Walnut: <0.1 kU/L
Wheat IgE: <0.1 kU/L

## 2020-03-03 LAB — INTERPRETATION:

## 2020-06-09 ENCOUNTER — Other Ambulatory Visit: Payer: Self-pay | Admitting: Family Medicine

## 2020-06-09 DIAGNOSIS — E038 Other specified hypothyroidism: Secondary | ICD-10-CM

## 2020-06-09 DIAGNOSIS — E088 Diabetes mellitus due to underlying condition with unspecified complications: Secondary | ICD-10-CM

## 2020-06-09 DIAGNOSIS — I10 Essential (primary) hypertension: Secondary | ICD-10-CM

## 2020-09-05 ENCOUNTER — Ambulatory Visit (INDEPENDENT_AMBULATORY_CARE_PROVIDER_SITE_OTHER): Payer: 59 | Admitting: Family Medicine

## 2020-09-05 ENCOUNTER — Encounter: Payer: Self-pay | Admitting: Family Medicine

## 2020-09-05 ENCOUNTER — Other Ambulatory Visit: Payer: Self-pay

## 2020-09-05 VITALS — BP 132/80 | HR 73 | Temp 98.0°F | Ht 64.0 in | Wt 153.2 lb

## 2020-09-05 DIAGNOSIS — Z1211 Encounter for screening for malignant neoplasm of colon: Secondary | ICD-10-CM | POA: Diagnosis not present

## 2020-09-05 DIAGNOSIS — E039 Hypothyroidism, unspecified: Secondary | ICD-10-CM

## 2020-09-05 DIAGNOSIS — E1165 Type 2 diabetes mellitus with hyperglycemia: Secondary | ICD-10-CM | POA: Diagnosis not present

## 2020-09-05 DIAGNOSIS — Z125 Encounter for screening for malignant neoplasm of prostate: Secondary | ICD-10-CM | POA: Diagnosis not present

## 2020-09-05 DIAGNOSIS — Z0000 Encounter for general adult medical examination without abnormal findings: Secondary | ICD-10-CM

## 2020-09-05 NOTE — Progress Notes (Signed)
Chief Complaint  Patient presents with  . Annual Exam    Well Male Troy Valentine is here for a complete physical.   His last physical was >1 year ago.  Current diet: in general, a "healthy" diet.  Current exercise: walking, active with [employer] Weight trend: decreased a little Fatigue out of ordinary? No. Seat belt? Yes.    Health maintenance Shingrix- No Colonoscopy- No Tetanus- Yes HIV- Yes Hep C- Yes   Past Medical History:  Diagnosis Date  . Allergy   . Diabetes mellitus without complication (HCC)   . Heart murmur   . Hypertension   . Thyroid disease       Past Surgical History:  Procedure Laterality Date  . COARCTATION OF AORTA EXCISION      Medications  Current Outpatient Medications on File Prior to Visit  Medication Sig Dispense Refill  . aspirin 81 MG tablet Take 81 mg by mouth daily.    Marland Kitchen lisinopril (ZESTRIL) 20 MG tablet TAKE 1 TABLET DAILY 90 tablet 2  . metFORMIN (GLUCOPHAGE-XR) 500 MG 24 hr tablet TAKE 1 TABLET DAILY 90 tablet 2  . SYNTHROID 88 MCG tablet TAKE 1 TABLET DAILY BEFORE BREAKFAST 90 tablet 2  . atorvastatin (LIPITOR) 20 MG tablet Take 1 tablet (20 mg total) by mouth daily. 90 tablet 3   Allergies No Known Allergies  Family History Family History  Problem Relation Age of Onset  . Hypertension Mother   . Kidney disease Mother     Review of Systems: Constitutional:  no fevers Eye:  no recent significant change in vision Ear/Nose/Mouth/Throat:  Ears:  no hearing loss Nose/Mouth/Throat:  no complaints of nasal congestion, no sore throat Cardiovascular:  no chest pain Respiratory:  no shortness of breath Gastrointestinal:  no change in bowel habits GU:  Male: negative for dysuria, frequency Musculoskeletal/Extremities:  no joint pain Integumentary (Skin/Breast):  no abnormal skin lesions reported Neurologic:  no headaches Endocrine: No unexpected weight changes Hematologic/Lymphatic:  no abnormal  bleeding  Exam BP 132/80 (BP Location: Left Arm, Patient Position: Sitting, Cuff Size: Normal)   Pulse 73   Temp 98 F (36.7 C) (Oral)   Ht 5\' 4"  (1.626 m)   Wt 153 lb 4 oz (69.5 kg)   SpO2 98%   BMI 26.31 kg/m  General:  well developed, well nourished, in no apparent distress Skin:  no significant moles, warts, or growths Head:  no masses, lesions, or tenderness Eyes:  pupils equal and round, sclera anicteric without injection Ears:  canals without lesions, TMs shiny without retraction, no obvious effusion, no erythema Nose:  nares patent, septum midline, mucosa normal Throat/Pharynx:  lips and gingiva without lesion; tongue and uvula midline; non-inflamed pharynx; no exudates or postnasal drainage Neck: neck supple without adenopathy, thyromegaly, or masses Cardiac: RRR, no bruits, no LE edema Lungs:  clear to auscultation, breath sounds equal bilaterally, no respiratory distress Rectal: Deferred Musculoskeletal:  symmetrical muscle groups noted without atrophy or deformity Neuro:  gait normal; deep tendon reflexes normal and symmetric Psych: well oriented with normal range of affect and appropriate judgment/insight  Assessment and Plan  Well adult exam  Type 2 diabetes mellitus with hyperglycemia, without long-term current use of insulin (HCC) - Plan: Lipid panel, Hemoglobin A1c, Microalbumin / creatinine urine ratio, Comprehensive metabolic panel, CBC  Hypothyroidism, unspecified type - Plan: TSH  Screen for colon cancer - Plan: Ambulatory referral to Gastroenterology  Screening for prostate cancer - Plan: PSA   Well 59 y.o. male. Counseled  on diet and exercise. Counseled on risks and benefits of prostate cancer screening with PSA. The patient agrees to undergo testing. Needs to sched eye exam. +covid in office prevented a recent visit, he will reach out though. Re-refer to GI for [facility]. Pt is getting flu shot at work.  Immunizations, labs, and further orders as  above. Follow up in 6 mo for DM visit or prn. The patient voiced understanding and agreement to the plan.  Jilda Roche [place], DO 09/05/20 7:26 AM

## 2020-09-05 NOTE — Patient Instructions (Addendum)
Give [name] 2-3 business days to get the results of your labs back.   The new Shingrix vaccine (for shingles) is a 2 shot series. It can make people feel low energy, achy and almost like they have the flu for 48 hours after injection. Please plan accordingly when deciding on when to get this shot. Call our office for a nurse visit appointment to get this. The second shot of the series is less severe regarding the side effects, but it still lasts 48 hours.   Keep the diet clean and stay active.  Your biggest homework: schedule your eye exam  Let us know if you need anything.

## 2020-09-06 LAB — MICROALBUMIN / CREATININE URINE RATIO
Creatinine, Urine: 123 mg/dL (ref 20–320)
Microalb Creat Ratio: 7 mcg/mg creat (ref <30)
Microalb, Ur: 0.9 mg/dL

## 2020-09-06 LAB — LIPID PANEL
Cholesterol: 134 mg/dL (ref <200)
HDL: 52 mg/dL (ref >=40)
LDL Cholesterol (Calc): 66 mg/dL (calc)
Non-HDL Cholesterol (Calc): 82 mg/dL (calc) (ref <130)
Total CHOL/HDL Ratio: 2.6 (calc) (ref <5.0)
Triglycerides: 84 mg/dL (ref <150)

## 2020-09-06 LAB — CBC
HCT: 44.7 % (ref 38.5–50.0)
Hemoglobin: 15.8 g/dL (ref 13.2–17.1)
MCH: 30.6 pg (ref 27.0–33.0)
MCHC: 35.3 g/dL (ref 32.0–36.0)
MCV: 86.6 fL (ref 80.0–100.0)
MPV: 10.9 fL (ref 7.5–12.5)
Platelets: 208 10*3/uL (ref 140–400)
RBC: 5.16 10*6/uL (ref 4.20–5.80)
RDW: 12.6 % (ref 11.0–15.0)
WBC: 7.1 10*3/uL (ref 3.8–10.8)

## 2020-09-06 LAB — COMPREHENSIVE METABOLIC PANEL
AG Ratio: 1.9 (calc) (ref 1.0–2.5)
ALT: 18 U/L (ref 9–46)
AST: 16 U/L (ref 10–35)
Albumin: 4.4 g/dL (ref 3.6–5.1)
Alkaline phosphatase (APISO): 58 U/L (ref 35–144)
BUN: 16 mg/dL (ref 7–25)
CO2: 27 mmol/L (ref 20–32)
Calcium: 9.6 mg/dL (ref 8.6–10.3)
Chloride: 103 mmol/L (ref 98–110)
Creat: 0.85 mg/dL (ref 0.70–1.33)
Globulin: 2.3 g/dL (calc) (ref 1.9–3.7)
Glucose, Bld: 118 mg/dL — ABNORMAL HIGH (ref 65–99)
Potassium: 3.8 mmol/L (ref 3.5–5.3)
Sodium: 140 mmol/L (ref 135–146)
Total Bilirubin: 1 mg/dL (ref 0.2–1.2)
Total Protein: 6.7 g/dL (ref 6.1–8.1)

## 2020-09-06 LAB — HEMOGLOBIN A1C
Hgb A1c MFr Bld: 5.8 % of total Hgb — ABNORMAL HIGH (ref <5.7)
Mean Plasma Glucose: 120 (calc)
eAG (mmol/L): 6.6 (calc)

## 2020-09-06 LAB — TSH: TSH: 0.86 mIU/L (ref 0.40–4.50)

## 2020-09-06 LAB — PSA: PSA: 0.31 ng/mL (ref <=4.0)

## 2020-10-13 ENCOUNTER — Encounter: Payer: Self-pay | Admitting: Family Medicine

## 2021-02-15 ENCOUNTER — Other Ambulatory Visit: Payer: Self-pay

## 2021-02-17 MED ORDER — ATORVASTATIN CALCIUM 20 MG PO TABS: 20.0000 mg | ORAL_TABLET | Freq: Every day | ORAL | 0 refills | Status: DC

## 2021-02-17 NOTE — Telephone Encounter (Signed)
Atorvastatin approved and sent with instructions to keep his appt in May for additional refills.

## 2021-03-05 ENCOUNTER — Other Ambulatory Visit: Payer: Self-pay

## 2021-03-05 DIAGNOSIS — I10 Essential (primary) hypertension: Secondary | ICD-10-CM | POA: Insufficient documentation

## 2021-03-05 DIAGNOSIS — E119 Type 2 diabetes mellitus without complications: Secondary | ICD-10-CM | POA: Insufficient documentation

## 2021-03-05 DIAGNOSIS — E079 Disorder of thyroid, unspecified: Secondary | ICD-10-CM | POA: Insufficient documentation

## 2021-03-05 DIAGNOSIS — T7840XA Allergy, unspecified, initial encounter: Secondary | ICD-10-CM | POA: Insufficient documentation

## 2021-03-05 DIAGNOSIS — R011 Cardiac murmur, unspecified: Secondary | ICD-10-CM | POA: Insufficient documentation

## 2021-03-06 ENCOUNTER — Other Ambulatory Visit: Payer: Self-pay | Admitting: Family Medicine

## 2021-03-06 DIAGNOSIS — E088 Diabetes mellitus due to underlying condition with unspecified complications: Secondary | ICD-10-CM

## 2021-03-11 ENCOUNTER — Encounter: Payer: Self-pay | Admitting: Family Medicine

## 2021-03-11 ENCOUNTER — Ambulatory Visit (INDEPENDENT_AMBULATORY_CARE_PROVIDER_SITE_OTHER): Payer: 59 | Admitting: Family Medicine

## 2021-03-11 ENCOUNTER — Other Ambulatory Visit: Payer: Self-pay

## 2021-03-11 VITALS — BP 132/80 | HR 74 | Temp 98.0°F | Ht 64.0 in | Wt 152.0 lb

## 2021-03-11 DIAGNOSIS — E1165 Type 2 diabetes mellitus with hyperglycemia: Secondary | ICD-10-CM

## 2021-03-11 DIAGNOSIS — I10 Essential (primary) hypertension: Secondary | ICD-10-CM | POA: Diagnosis not present

## 2021-03-11 DIAGNOSIS — E039 Hypothyroidism, unspecified: Secondary | ICD-10-CM | POA: Diagnosis not present

## 2021-03-11 LAB — COMPREHENSIVE METABOLIC PANEL
ALT: 21 U/L (ref 0–53)
AST: 18 U/L (ref 0–37)
Albumin: 4.3 g/dL (ref 3.5–5.2)
Alkaline Phosphatase: 61 U/L (ref 39–117)
BUN: 12 mg/dL (ref 6–23)
CO2: 28 mEq/L (ref 19–32)
Calcium: 9.2 mg/dL (ref 8.4–10.5)
Chloride: 105 mEq/L (ref 96–112)
Creatinine, Ser: 0.84 mg/dL (ref 0.40–1.50)
GFR: 96.17 mL/min (ref >60.00)
Glucose, Bld: 113 mg/dL — ABNORMAL HIGH (ref 70–99)
Potassium: 4.2 mEq/L (ref 3.5–5.1)
Sodium: 140 mEq/L (ref 135–145)
Total Bilirubin: 1.2 mg/dL (ref 0.2–1.2)
Total Protein: 6.5 g/dL (ref 6.0–8.3)

## 2021-03-11 LAB — LIPID PANEL
Cholesterol: 134 mg/dL (ref 0–200)
HDL: 48.5 mg/dL (ref >39.00)
LDL Cholesterol: 68 mg/dL (ref 0–99)
NonHDL: 85.74
Total CHOL/HDL Ratio: 3
Triglycerides: 89 mg/dL (ref 0.0–149.0)
VLDL: 17.8 mg/dL (ref 0.0–40.0)

## 2021-03-11 LAB — T4, FREE: Free T4: 0.83 ng/dL (ref 0.60–1.60)

## 2021-03-11 LAB — TSH: TSH: 2.26 u[IU]/mL (ref 0.35–4.50)

## 2021-03-11 LAB — HEMOGLOBIN A1C: Hgb A1c MFr Bld: 5.9 % (ref 4.6–6.5)

## 2021-03-11 NOTE — Progress Notes (Signed)
Subjective:   Chief Complaint  Patient presents with  . Follow-up    Troy Valentine is a 60 y.o. male here for follow-up of diabetes.   Troy Valentine does not routinely monitor labs.  Patient does not require insulin.   Medications include: Metformin XR 500 mg/d Diet is healthy.  Exercise: walking, stretching. Active in yard.  No Cp or SOB.  Hypertension Patient presents for hypertension follow up. He does not monitor home blood pressures. He is compliant with medication-lisinopril 20 mg/d. Patient has these side effects of medication: none Diet/exercise as above.   Hypothyroidism Patient presents for follow-up of hypothyroidism.  Reports compliance with medication- levothyroxine 88 mcg/d. Current symptoms include: denies fatigue, weight changes, heat/cold intolerance, bowel/skin changes or CVS symptoms He believes his dose should be unchanged  Past Medical History:  Diagnosis Date  . Allergy   . Diabetes mellitus due to underlying condition with unspecified complications (HCC) 09/18/2018  . Diabetes mellitus without complication (HCC)   . Encounter for long-term (current) use of other medications 09/13/2012  . Essential hypertension 09/13/2012  . Gastroesophageal reflux disease without esophagitis 02/19/2016  . Heart murmur   . History of aortic coarctation repair 02/19/2016  . Hyperlipidemia LDL goal <100 09/13/2012  . Hypertension   . Hypothyroidism 09/13/2012  . Need for hepatitis C screening test 06/15/2017   Patient declines this screening  Formatting of this note might be different from the original. Patient declines this screening  . Thyroid disease   . Type 2 diabetes mellitus with hyperglycemia, without long-term current use of insulin (HCC) 09/13/2012     Related testing: Retinal exam: Done Pneumovax: done  Objective:  BP 132/80 (BP Location: Left Arm, Patient Position: Sitting, Cuff Size: Normal)   Pulse 74   Temp 98 F (36.7 C) (Oral)   Ht 5\' 4"  (1.626  m)   Wt 152 lb (68.9 kg)   SpO2 99%   BMI 26.09 kg/m  General:  Well developed, well nourished, in no apparent distress Head:  Normocephalic, atraumatic Eyes:  Pupils equal and round, sclera anicteric without injection  Lungs:  CTAB, no access msc use Cardio:  RRR, no bruits, no LE edema Psych: Age appropriate judgment and insight  Assessment:   Type 2 diabetes mellitus with hyperglycemia, without long-term current use of insulin (HCC) - Plan: Comprehensive metabolic panel, Lipid panel, Hemoglobin A1c  Essential hypertension  Hypothyroidism, unspecified type - Plan: TSH, T4, free   Plan:   1. Chronic, controlled. Cont Metformin XR 500 mg/d. Does not need to monitor sugars. Counseled on diet and exercise. 2. Chronic, controlled. Cont lisinopril 20 mg/d. Does not need to monitor sugars.  3. Chronic, controlled. Cont Synthroid 88 mcg/d. Ck labs.  F/u in 6 mo for CPE. The patient voiced understanding and agreement to the plan.  [place], DO 03/11/21 7:08 AM

## 2021-03-11 NOTE — Patient Instructions (Signed)
Give us 2-3 business days to get the results of your labs back.   Keep up the good work.   Let us know if you need anything.  

## 2021-03-13 ENCOUNTER — Encounter: Payer: Self-pay | Admitting: Cardiology

## 2021-03-13 ENCOUNTER — Other Ambulatory Visit: Payer: Self-pay

## 2021-03-13 ENCOUNTER — Ambulatory Visit (INDEPENDENT_AMBULATORY_CARE_PROVIDER_SITE_OTHER): Payer: 59 | Admitting: Cardiology

## 2021-03-13 ENCOUNTER — Ambulatory Visit: Payer: 59 | Admitting: Cardiology

## 2021-03-13 VITALS — BP 140/90 | HR 78 | Ht 64.0 in | Wt 155.0 lb

## 2021-03-13 DIAGNOSIS — Z8774 Personal history of (corrected) congenital malformations of heart and circulatory system: Secondary | ICD-10-CM | POA: Diagnosis not present

## 2021-03-13 DIAGNOSIS — E088 Diabetes mellitus due to underlying condition with unspecified complications: Secondary | ICD-10-CM

## 2021-03-13 DIAGNOSIS — I10 Essential (primary) hypertension: Secondary | ICD-10-CM

## 2021-03-13 DIAGNOSIS — E785 Hyperlipidemia, unspecified: Secondary | ICD-10-CM

## 2021-03-13 NOTE — Patient Instructions (Signed)
Medication Instructions:  °Your physician recommends that you continue on your current medications as directed. Please refer to the Current Medication list given to you today. ° °*If you need a refill on your cardiac medications before your next appointment, please call your pharmacy* ° ° °Lab Work: °None °If you have labs (blood work) drawn today and your tests are completely normal, you will receive your results only by: °MyChart Message (if you have MyChart) OR °A paper copy in the mail °If you have any lab test that is abnormal or we need to change your treatment, we will call you to review the results. ° ° °Testing/Procedures: °None ° ° °Follow-Up: °At [facility], you and your health needs are our priority.  As part of our continuing mission to provide you with exceptional heart care, we have created designated Provider Care Teams.  These Care Teams include your primary Cardiologist (physician) and Advanced Practice Providers (APPs -  Physician Assistants and Nurse Practitioners) who all work together to provide you with the care you need, when you need it. ° °We recommend signing up for the patient portal called "MyChart".  Sign up information is provided on this After Visit Summary.  MyChart is used to connect with patients for Virtual Visits (Telemedicine).  Patients are able to view lab/test results, encounter notes, upcoming appointments, etc.  Non-urgent messages can be sent to your provider as well.   °To learn more about what you can do with MyChart, go to [email or web address].   ° °Your next appointment:   °9 month(s) ° °The format for your next appointment:   °In Person ° °Provider:   °Rajan Revankar, MD  ° ° °Other Instructions ° ° °

## 2021-03-13 NOTE — Progress Notes (Signed)
Cardiology Office Note:    Date:  03/13/2021   ID:  Troy Valentine, DOB 1961-10-17, MRN [id number]  PCP:  Sharlene Dory, DO  Cardiologist:  Garwin Brothers, MD   Referring MD: Sharlene Dory*    ASSESSMENT:    1. Essential hypertension   2. Hyperlipidemia LDL goal <100   3. Diabetes mellitus due to underlying condition with unspecified complications (HCC)   4. History of aortic coarctation repair    PLAN:    In order of problems listed above:  1. Primary prevention stressed with the patient.  Importance of compliance with diet medication stressed any vocalized understanding. 2. Essential hypertension: Blood pressure stable and lifestyle modification was urged.  His blood pressure is fine at home and he mentioned a blood pressure recordings. 3. Mixed dyslipidemia: On statin therapy and lipids are fine and was done recently and I reviewed them 4. Diabetes mellitus with unspecified complications: Stable at this time. 5. S/p repair of coarctation of aorta: Stable. 6. Patient will be seen in follow-up appointment in 9 months or earlier if the patient has any concerns    Medication Adjustments/Labs and Tests Ordered: Current medicines are reviewed at length with the patient today.  Concerns regarding medicines are outlined above.  Orders Placed This Encounter  Procedures  . EKG 12-Lead   No orders of the defined types were placed in this encounter.    Chief Complaint  Patient presents with  . Follow-up     History of Present Illness:    Troy Valentine is a 60 y.o. male.  Patient has past medical history of essential hypertension, dyslipidemia, diabetes mellitus and repair of coarctation of the aorta.  He denies any problems at this time and takes care of activities of daily living.  No chest pain orthopnea or PND.  He exercises on a regular basis.  At the time of my evaluation, the patient is alert awake oriented and in no distress.  He walks at  least half an hour a day 4-5 times a week.  Past Medical History:  Diagnosis Date  . Allergy   . Diabetes mellitus due to underlying condition with unspecified complications (HCC) 09/18/2018  . Diabetes mellitus without complication (HCC)   . Encounter for long-term (current) use of other medications 09/13/2012  . Essential hypertension 09/13/2012  . Gastroesophageal reflux disease without esophagitis 02/19/2016  . Heart murmur   . History of aortic coarctation repair 02/19/2016  . Hyperlipidemia LDL goal <100 09/13/2012  . Hypertension   . Hypothyroidism 09/13/2012  . Need for hepatitis C screening test 06/15/2017   Patient declines this screening  Formatting of this note might be different from the original. Patient declines this screening  . Thyroid disease   . Type 2 diabetes mellitus with hyperglycemia, without long-term current use of insulin (HCC) 09/13/2012    Past Surgical History:  Procedure Laterality Date  . COARCTATION OF AORTA EXCISION      Current Medications: Current Meds  Medication Sig  . aspirin 81 MG tablet Take 81 mg by mouth daily.  Marland Kitchen atorvastatin (LIPITOR) 20 MG tablet Take 1 tablet (20 mg total) by mouth daily.  Marland Kitchen lisinopril (ZESTRIL) 20 MG tablet TAKE 1 TABLET DAILY  . metFORMIN (GLUCOPHAGE-XR) 500 MG 24 hr tablet TAKE 1 TABLET DAILY  . SYNTHROID 88 MCG tablet TAKE 1 TABLET DAILY BEFORE BREAKFAST     Allergies:   Patient has no known allergies.   Social History   Socioeconomic History  .  Marital status: Married    Spouse name: Not on file  . Number of children: Not on file  . Years of education: Not on file  . Highest education level: Not on file  Occupational History  . Not on file  Tobacco Use  . Smoking status: Never Smoker  . Smokeless tobacco: Never Used  Substance and Sexual Activity  . Alcohol use: No  . Drug use: No  . Sexual activity: Yes    Birth control/protection: None  Other Topics Concern  . Not on file  Social History Narrative   . Not on file   Social Determinants of Health   Financial Resource Strain: Not on file  Food Insecurity: Not on file  Transportation Needs: Not on file  Physical Activity: Not on file  Stress: Not on file  Social Connections: Not on file     Family History: The patient's family history includes Hypertension in his mother; Kidney disease in his mother.  ROS:   Please see the history of present illness.    All other systems reviewed and are negative.  EKGs/Labs/Other Studies Reviewed:    The following studies were reviewed today: EKG reveals sinus rhythm and nonspecific ST-T changes   Recent Labs: 09/05/2020: Hemoglobin 15.8; Platelets 208 03/11/2021: ALT 21; BUN 12; Creatinine, Ser 0.84; Potassium 4.2; Sodium 140; TSH 2.26  Recent Lipid Panel    Component Value Date/Time   CHOL 134 03/11/2021 0713   CHOL 242 (H) 07/18/2019 0811   TRIG 89.0 03/11/2021 0713   HDL 48.50 03/11/2021 0713   HDL 49 07/18/2019 0811   CHOLHDL 3 03/11/2021 0713   VLDL 17.8 03/11/2021 0713   LDLCALC 68 03/11/2021 0713   LDLCALC 66 09/05/2020 0732    Physical Exam:    VS:  BP 140/90 (BP Location: Right Arm, Patient Position: Sitting, Cuff Size: Normal)   Pulse 78   Ht 5\' 4"  (1.626 m)   Wt 155 lb (70.3 kg)   SpO2 98%   BMI 26.61 kg/m     Wt Readings from Last 3 Encounters:  03/13/21 155 lb (70.3 kg)  03/11/21 152 lb (68.9 kg)  09/05/20 153 lb 4 oz (69.5 kg)     GEN: Patient is in no acute distress HEENT: Normal NECK: No JVD; No carotid bruits LYMPHATICS: No lymphadenopathy CARDIAC: Hear sounds regular, 2/6 systolic murmur at the apex. RESPIRATORY:  Clear to auscultation without rales, wheezing or rhonchi  ABDOMEN: Soft, non-tender, non-distended MUSCULOSKELETAL:  No edema; No deformity  SKIN: Warm and dry NEUROLOGIC:  Alert and oriented x 3 PSYCHIATRIC:  Normal affect   Signed, 09/07/20, MD  03/13/2021 8:26 AM     [facility]

## 2021-03-13 NOTE — Addendum Note (Signed)
Addended by: Roosvelt Harps R on: 03/13/2021 09:04 AM   Modules accepted: Orders

## 2021-05-22 ENCOUNTER — Other Ambulatory Visit: Payer: Self-pay | Admitting: Cardiology

## 2021-05-25 NOTE — Telephone Encounter (Signed)
Atorvastatin 20 mg # 90 x 3 additional refills sent to [facility] [place], [place] - [address] AT Portal to Registered [employer] Sites

## 2021-09-14 ENCOUNTER — Other Ambulatory Visit: Payer: Self-pay

## 2021-09-14 ENCOUNTER — Ambulatory Visit (INDEPENDENT_AMBULATORY_CARE_PROVIDER_SITE_OTHER): Payer: 59 | Admitting: Family Medicine

## 2021-09-14 ENCOUNTER — Other Ambulatory Visit: Payer: 59

## 2021-09-14 ENCOUNTER — Other Ambulatory Visit: Payer: Self-pay | Admitting: Family Medicine

## 2021-09-14 ENCOUNTER — Encounter: Payer: Self-pay | Admitting: Family Medicine

## 2021-09-14 VITALS — BP 130/78 | HR 77 | Temp 98.2°F | Ht 64.0 in | Wt 153.2 lb

## 2021-09-14 DIAGNOSIS — E1165 Type 2 diabetes mellitus with hyperglycemia: Secondary | ICD-10-CM

## 2021-09-14 DIAGNOSIS — R5383 Other fatigue: Secondary | ICD-10-CM | POA: Diagnosis not present

## 2021-09-14 DIAGNOSIS — R058 Other specified cough: Secondary | ICD-10-CM

## 2021-09-14 DIAGNOSIS — Z0000 Encounter for general adult medical examination without abnormal findings: Secondary | ICD-10-CM | POA: Diagnosis not present

## 2021-09-14 DIAGNOSIS — Z23 Encounter for immunization: Secondary | ICD-10-CM

## 2021-09-14 DIAGNOSIS — Z1211 Encounter for screening for malignant neoplasm of colon: Secondary | ICD-10-CM | POA: Diagnosis not present

## 2021-09-14 DIAGNOSIS — Z125 Encounter for screening for malignant neoplasm of prostate: Secondary | ICD-10-CM | POA: Diagnosis not present

## 2021-09-14 DIAGNOSIS — E559 Vitamin D deficiency, unspecified: Secondary | ICD-10-CM

## 2021-09-14 DIAGNOSIS — E538 Deficiency of other specified B group vitamins: Secondary | ICD-10-CM | POA: Diagnosis not present

## 2021-09-14 LAB — CBC
HCT: 44.2 % (ref 39.0–52.0)
Hemoglobin: 15.2 g/dL (ref 13.0–17.0)
MCHC: 34.4 g/dL (ref 30.0–36.0)
MCV: 84.9 fl (ref 78.0–100.0)
Platelets: 219 10*3/uL (ref 150.0–400.0)
RBC: 5.21 Mil/uL (ref 4.22–5.81)
RDW: 13.1 % (ref 11.5–15.5)
WBC: 7.5 10*3/uL (ref 4.0–10.5)

## 2021-09-14 LAB — COMPREHENSIVE METABOLIC PANEL
ALT: 23 U/L (ref 0–53)
AST: 19 U/L (ref 0–37)
Albumin: 4.5 g/dL (ref 3.5–5.2)
Alkaline Phosphatase: 71 U/L (ref 39–117)
BUN: 14 mg/dL (ref 6–23)
CO2: 29 mEq/L (ref 19–32)
Calcium: 9.2 mg/dL (ref 8.4–10.5)
Chloride: 102 mEq/L (ref 96–112)
Creatinine, Ser: 0.87 mg/dL (ref 0.40–1.50)
GFR: 94.81 mL/min (ref >60.00)
Glucose, Bld: 117 mg/dL — ABNORMAL HIGH (ref 70–99)
Potassium: 3.8 mEq/L (ref 3.5–5.1)
Sodium: 138 mEq/L (ref 135–145)
Total Bilirubin: 1.7 mg/dL — ABNORMAL HIGH (ref 0.2–1.2)
Total Protein: 6.9 g/dL (ref 6.0–8.3)

## 2021-09-14 LAB — MICROALBUMIN / CREATININE URINE RATIO
Creatinine,U: 187.3 mg/dL
Microalb Creat Ratio: 0.6 mg/g (ref 0.0–30.0)
Microalb, Ur: 1.2 mg/dL (ref 0.0–1.9)

## 2021-09-14 LAB — LIPID PANEL
Cholesterol: 156 mg/dL (ref 0–200)
HDL: 47.7 mg/dL (ref >39.00)
LDL Cholesterol: 82 mg/dL (ref 0–99)
NonHDL: 108.71
Total CHOL/HDL Ratio: 3
Triglycerides: 134 mg/dL (ref 0.0–149.0)
VLDL: 26.8 mg/dL (ref 0.0–40.0)

## 2021-09-14 LAB — VITAMIN D 25 HYDROXY (VIT D DEFICIENCY, FRACTURES): VITD: 11.32 ng/mL — ABNORMAL LOW (ref 30.00–100.00)

## 2021-09-14 LAB — PSA: PSA: 0.39 ng/mL (ref 0.10–4.00)

## 2021-09-14 LAB — VITAMIN B12: Vitamin B-12: 191 pg/mL — ABNORMAL LOW (ref 211–911)

## 2021-09-14 LAB — HEMOGLOBIN A1C: Hgb A1c MFr Bld: 5.8 % (ref 4.6–6.5)

## 2021-09-14 LAB — TSH: TSH: 0.78 u[IU]/mL (ref 0.35–5.50)

## 2021-09-14 MED ORDER — VITAMIN D (ERGOCALCIFEROL) 1.25 MG (50000 UNIT) PO CAPS: 50000.0000 [IU] | ORAL_CAPSULE | ORAL | 0 refills | Status: DC

## 2021-09-14 MED ORDER — LISINOPRIL 20 MG PO TABS: 20.0000 mg | ORAL_TABLET | Freq: Every day | ORAL | 3 refills | Status: DC

## 2021-09-14 MED ORDER — METFORMIN HCL ER 500 MG PO TB24: 500.0000 mg | ORAL_TABLET | Freq: Every day | ORAL | 3 refills | Status: DC

## 2021-09-14 MED ORDER — FLUTICASONE PROPIONATE HFA 110 MCG/ACT IN AERO: 2.0000 | INHALATION_SPRAY | Freq: Two times a day (BID) | RESPIRATORY_TRACT | 1 refills | Status: DC

## 2021-09-14 MED ORDER — LEVOTHYROXINE SODIUM 88 MCG PO TABS: 88.0000 ug | ORAL_TABLET | Freq: Every day | ORAL | 3 refills | Status: DC

## 2021-09-14 NOTE — Progress Notes (Signed)
Chief Complaint  Patient presents with   Annual Exam   Cough    Well Male Troy Valentine is here for a complete physical.   His last physical was >1 year ago.  Current diet: in general, a "healthy" diet.  Current exercise: had flu like illness 4 weeks ago and has not exercised since that time Weight trend: stable Fatigue out of ordinary? Yes. Seat belt? Yes.   Flu like illness 1 mo ago started having flu like symptoms. Did not test, got better after 1 week but having fatigue, coughing, chest tightness, and ST. Feels post nasal drainage. Took DM and Mucinex w little relief. No fevers, wheezing.    Health maintenance Shingrix- No Colonoscopy- No Tetanus- Yes HIV- Yes Hep C- Yes   Past Medical History:  Diagnosis Date   Allergy    Diabetes mellitus due to underlying condition with unspecified complications (HCC) 09/18/2018   Diabetes mellitus without complication (HCC)    Encounter for long-term (current) use of other medications 09/13/2012   Essential hypertension 09/13/2012   Gastroesophageal reflux disease without esophagitis 02/19/2016   Heart murmur    History of aortic coarctation repair 02/19/2016   Hyperlipidemia LDL goal <100 09/13/2012   Hypertension    Hypothyroidism 09/13/2012   Need for hepatitis C screening test 06/15/2017   Patient declines this screening  Formatting of this note might be different from the original. Patient declines this screening   Thyroid disease    Type 2 diabetes mellitus with hyperglycemia, without long-term current use of insulin (HCC) 09/13/2012      Past Surgical History:  Procedure Laterality Date   COARCTATION OF AORTA EXCISION      Medications  Current Outpatient Medications on File Prior to Visit  Medication Sig Dispense Refill   aspirin 81 MG tablet Take 81 mg by mouth daily.     atorvastatin (LIPITOR) 20 MG tablet TAKE 1 TABLET DAILY 90 tablet 3   levothyroxine (SYNTHROID) 88 MCG tablet Take 88 mcg by mouth daily before  breakfast.     lisinopril (ZESTRIL) 20 MG tablet Take 20 mg by mouth daily.     metFORMIN (GLUCOPHAGE-XR) 500 MG 24 hr tablet Take 500 mg by mouth daily with breakfast.     Allergies No Known Allergies  Family History Family History  Problem Relation Age of Onset   Hypertension Mother    Kidney disease Mother     Review of Systems: Constitutional:  no fevers Eye:  no recent significant change in vision Ear/Nose/Mouth/Throat:  Ears:  no hearing loss Nose/Mouth/Throat:  no complaints of nasal congestion, no sore throat Cardiovascular:  no chest pain Respiratory:  no shortness of breath, +coughing Gastrointestinal:  no change in bowel habits GU:  Male: negative for dysuria, frequency Musculoskeletal/Extremities:  no joint pain Integumentary (Skin/Breast):  no abnormal skin lesions reported Neurologic:  no headaches Endocrine: No unexpected weight changes Hematologic/Lymphatic:  no abnormal bleeding  Exam BP 130/78   Pulse 77   Temp 98.2 F (36.8 C) (Oral)   Ht 5\' 4"  (1.626 m)   Wt 153 lb 4 oz (69.5 kg)   SpO2 99%   BMI 26.31 kg/m  General:  well developed, well nourished, in no apparent distress Skin:  no significant moles, warts, or growths Head:  no masses, lesions, or tenderness Eyes:  pupils equal and round, sclera anicteric without injection Ears:  canals without lesions, TMs shiny without retraction, no obvious effusion, no erythema Nose:  nares patent, septum midline, mucosa normal Throat/Pharynx:  lips and gingiva without lesion; tongue and uvula midline; non-inflamed pharynx; no exudates or postnasal drainage Neck: neck supple without adenopathy, thyromegaly, or masses Cardiac: RRR, no bruits, no LE edema Lungs:  clear to auscultation, breath sounds equal bilaterally, no respiratory distress Abdomen: BS+, soft, non-tender, non-distended, no masses or organomegaly noted Rectal: Deferred Musculoskeletal: +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; otherwise  symmetrical muscle groups noted without atrophy or deformity Neuro:  gait normal; deep tendon reflexes normal and symmetric Psych: well oriented with normal range of affect and appropriate judgment/insight  Assessment and Plan  Well adult exam - Plan: CBC, Comprehensive metabolic panel, Lipid panel  Type 2 diabetes mellitus with hyperglycemia, without long-term current use of insulin (HCC) - Plan: Hemoglobin A1c, Microalbumin / creatinine urine ratio  Screening for prostate cancer - Plan: PSA  Fatigue, unspecified type - Plan: VITAMIN D 25 Hydroxy (Vit-D Deficiency, Fractures), TSH, B12  Screen for colon cancer - Plan: Ambulatory referral to Gastroenterology  Post-viral cough syndrome - Plan: fluticasone (FLOVENT HFA) 110 MCG/ACT inhaler   Well 60 y.o. male. Counseled on diet and exercise. Counseled on risks and benefits of prostate cancer screening with PSA. The patient agrees to undergo testing. Immunizations, labs, and further orders as above. Shingrix rec'd in addition to bivalent covid vaccine booster.  Flu shot, PCV20 today. Foot exam today.  Re-refer to GI for CCS.  Follow up in 6 mo or prn. The patient voiced understanding and agreement to the plan.  Jilda Roche [place], DO 09/14/21 7:24 AM

## 2021-09-14 NOTE — Patient Instructions (Addendum)
Give [name] 2-3 business days to get the results of your labs back.   Keep the diet clean and stay active.  The new Shingrix vaccine (for shingles) is a 2 shot series. It can make people feel low energy, achy and almost like they have the flu for 48 hours after injection. Please plan accordingly when deciding on when to get this shot. Call our office for a nurse visit appointment to get this. The second shot of the series is less severe regarding the side effects, but it still lasts 48 hours.   I recommend getting the updated bivalent covid vaccination booster at your convenience.   The cough should last around 6-8 weeks total, a lot of times sooner.   If you do not hear anything about your referral in the next 1-2 weeks, call our office and ask for an update.  Let us know if you need anything.

## 2021-09-14 NOTE — Addendum Note (Signed)
Addended by: Scharlene Gloss B on: 09/14/2021 07:37 AM   Modules accepted: Orders

## 2021-09-17 LAB — INTRINSIC FACTOR ANTIBODIES: Intrinsic Factor: NEGATIVE

## 2021-10-04 ENCOUNTER — Other Ambulatory Visit: Payer: Self-pay | Admitting: Family Medicine

## 2021-12-08 ENCOUNTER — Other Ambulatory Visit: Payer: Self-pay

## 2021-12-09 ENCOUNTER — Other Ambulatory Visit: Payer: Self-pay

## 2021-12-09 ENCOUNTER — Encounter: Payer: Self-pay | Admitting: Cardiology

## 2021-12-09 ENCOUNTER — Other Ambulatory Visit: Payer: Self-pay | Admitting: Family Medicine

## 2021-12-09 ENCOUNTER — Ambulatory Visit (INDEPENDENT_AMBULATORY_CARE_PROVIDER_SITE_OTHER): Payer: 59 | Admitting: Cardiology

## 2021-12-09 ENCOUNTER — Other Ambulatory Visit (INDEPENDENT_AMBULATORY_CARE_PROVIDER_SITE_OTHER): Payer: 59

## 2021-12-09 ENCOUNTER — Ambulatory Visit: Payer: 59 | Admitting: Cardiology

## 2021-12-09 VITALS — BP 156/86 | HR 90 | Ht 60.0 in | Wt 157.2 lb

## 2021-12-09 DIAGNOSIS — E785 Hyperlipidemia, unspecified: Secondary | ICD-10-CM | POA: Diagnosis not present

## 2021-12-09 DIAGNOSIS — I10 Essential (primary) hypertension: Secondary | ICD-10-CM | POA: Diagnosis not present

## 2021-12-09 DIAGNOSIS — Z8774 Personal history of (corrected) congenital malformations of heart and circulatory system: Secondary | ICD-10-CM | POA: Diagnosis not present

## 2021-12-09 DIAGNOSIS — E1165 Type 2 diabetes mellitus with hyperglycemia: Secondary | ICD-10-CM | POA: Diagnosis not present

## 2021-12-09 DIAGNOSIS — R011 Cardiac murmur, unspecified: Secondary | ICD-10-CM

## 2021-12-09 DIAGNOSIS — E559 Vitamin D deficiency, unspecified: Secondary | ICD-10-CM

## 2021-12-09 HISTORY — DX: Cardiac murmur, unspecified: R01.1

## 2021-12-09 LAB — VITAMIN D 25 HYDROXY (VIT D DEFICIENCY, FRACTURES): VITD: 22.11 ng/mL — ABNORMAL LOW (ref 30.00–100.00)

## 2021-12-09 MED ORDER — LISINOPRIL 40 MG PO TABS
40.0000 mg | ORAL_TABLET | Freq: Every day | ORAL | 3 refills | Status: DC
Start: 2021-12-09 — End: 2022-03-15

## 2021-12-09 MED ORDER — VITAMIN D (ERGOCALCIFEROL) 1.25 MG (50000 UNIT) PO CAPS: 50000.0000 [IU] | ORAL_CAPSULE | ORAL | 0 refills | Status: DC

## 2021-12-09 NOTE — Patient Instructions (Signed)
Medication Instructions:  Your physician has recommended you make the following change in your medication:  INCREASE LISINOPRIL TO 40 MG ONCE DAILY  *If you need a refill on your cardiac medications before your next appointment, please call your pharmacy*   Lab Work: Your physician recommends that you return for lab work in: 2 WEEKS FOR BMET  If you have labs (blood work) drawn today and your tests are completely normal, you will receive your results only by: MyChart Message (if you have MyChart) OR A paper copy in the mail If you have any lab test that is abnormal or we need to change your treatment, we will call you to review the results.   Testing/Procedures: Your physician has requested that you have an echocardiogram. Echocardiography is a painless test that uses sound waves to create images of your heart. It provides your doctor with information about the size and shape of your heart and how well your hearts chambers and valves are working. This procedure takes approximately one hour. There are no restrictions for this procedure.    Follow-Up: At [facility], you and your health needs are our priority.  As part of our continuing mission to provide you with exceptional heart care, we have created designated Provider Care Teams.  These Care Teams include your primary Cardiologist (physician) and Advanced Practice Providers (APPs -  Physician Assistants and Nurse Practitioners) who all work together to provide you with the care you need, when you need it.  We recommend signing up for the patient portal called "MyChart".  Sign up information is provided on this After Visit Summary.  MyChart is used to connect with patients for Virtual Visits (Telemedicine).  Patients are able to view lab/test results, encounter notes, upcoming appointments, etc.  Non-urgent messages can be sent to your provider as well.   To learn more about what you can do with MyChart, go to [email or web address].     Your next appointment:   6 month(s)  The format for your next appointment:   In Person  Provider:   Belva Crome, MD    Other Instructions

## 2021-12-09 NOTE — Progress Notes (Signed)
Cardiology Office Note:    Date:  12/09/2021   ID:  Troy Valentine, DOB May 23, 1961, MRN [id number]  PCP:  Sharlene Dory, DO  Cardiologist:  Garwin Brothers, MD   Referring MD: Sharlene Dory*    ASSESSMENT:    1. Essential hypertension   2. Type 2 diabetes mellitus with hyperglycemia, without long-term current use of insulin (HCC)   3. History of aortic coarctation repair   4. Hyperlipidemia LDL goal <100   5. Cardiac murmur    PLAN:    In order of problems listed above:  Primary prevention stressed with the patient.  Importance of compliance with diet medication stressed any vocalized understanding.  He had vitamin D blood work today and it was pretty low and his medical doctor will get in touch with him about this. Essential hypertension: Blood pressure is elevated and it has been so in the past several weeks.  Increase lisinopril to 40 mg daily and he will be back in 2 weeks for a Chem-7.  At that time keeping a blood pressure log. Mixed dyslipidemia: Lipids were reviewed and diet was emphasized. Cardiac murmur: Echocardiogram will be done to assess murmur heard on auscultation. Diabetes mellitus: Managed by primary care.  Diet and exercise stressed.  He was advised to walk at least half an hour a day 5 days a week and he promises to do so. S/p repair of coarctation of aorta in the remote past.  Stable at this time. Patient will be seen in follow-up appointment in 6 months or earlier if the patient has any concerns    Medication Adjustments/Labs and Tests Ordered: Current medicines are reviewed at length with the patient today.  Concerns regarding medicines are outlined above.  No orders of the defined types were placed in this encounter.  No orders of the defined types were placed in this encounter.    No chief complaint on file.    History of Present Illness:    Troy Valentine is a 61 y.o. male.  Patient has past medical history of  essential hypertension, dyslipidemia, diabetes mellitus and history of repair of coarctation of aorta.  He denies any problems at this time and takes care of activities of daily living.  No chest pain orthopnea or PND.  He overall leads a sedentary lifestyle but walks some on a regular basis.  Past Medical History:  Diagnosis Date   Allergy    Diabetes mellitus due to underlying condition with unspecified complications (HCC) 09/18/2018   Diabetes mellitus without complication (HCC)    Encounter for long-term (current) use of other medications 09/13/2012   Essential hypertension 09/13/2012   Gastroesophageal reflux disease without esophagitis 02/19/2016   Heart murmur    History of aortic coarctation repair 02/19/2016   Hyperlipidemia LDL goal <100 09/13/2012   Hypertension    Hypothyroidism 09/13/2012   Need for hepatitis C screening test 06/15/2017   Patient declines this screening  Formatting of this note might be different from the original. Patient declines this screening   Thyroid disease    Type 2 diabetes mellitus with hyperglycemia, without long-term current use of insulin (HCC) 09/13/2012    Past Surgical History:  Procedure Laterality Date   COARCTATION OF AORTA EXCISION      Current Medications: Current Meds  Medication Sig   aspirin 81 MG tablet Take 81 mg by mouth daily.   atorvastatin (LIPITOR) 20 MG tablet Take 20 mg by mouth daily.   fluticasone (FLOVENT HFA) 110 MCG/ACT  inhaler Inhale 2 puffs into the lungs in the morning and at bedtime. Rinse mouth out after use.   levothyroxine (SYNTHROID) 88 MCG tablet Take 1 tablet (88 mcg total) by mouth daily before breakfast.   lisinopril (ZESTRIL) 20 MG tablet Take 1 tablet (20 mg total) by mouth daily.   metFORMIN (GLUCOPHAGE-XR) 500 MG 24 hr tablet Take 1 tablet (500 mg total) by mouth daily with breakfast.   Vitamin D, Ergocalciferol, (DRISDOL) 1.25 MG (50000 UNIT) CAPS capsule Take 1 capsule (50,000 Units total) by mouth every 7  (seven) days.     Allergies:   Patient has no known allergies.   Social History   Socioeconomic History   Marital status: Married    Spouse name: Not on file   Number of children: Not on file   Years of education: Not on file   Highest education level: Not on file  Occupational History   Not on file  Tobacco Use   Smoking status: Never   Smokeless tobacco: Never  Substance and Sexual Activity   Alcohol use: No   Drug use: No   Sexual activity: Yes    Birth control/protection: None  Other Topics Concern   Not on file  Social History Narrative   Not on file   Social Determinants of Health   Financial Resource Strain: Not on file  Food Insecurity: Not on file  Transportation Needs: Not on file  Physical Activity: Not on file  Stress: Not on file  Social Connections: Not on file     Family History: The patient's family history includes Hypertension in his mother; Kidney disease in his mother.  ROS:   Please see the history of present illness.    All other systems reviewed and are negative.  EKGs/Labs/Other Studies Reviewed:    The following studies were reviewed today: I discussed my findings with the patient at length   Recent Labs: 09/14/2021: ALT 23; BUN 14; Creatinine, Ser 0.87; Hemoglobin 15.2; Platelets 219.0; Potassium 3.8; Sodium 138; TSH 0.78  Recent Lipid Panel    Component Value Date/Time   CHOL 156 09/14/2021 0729   CHOL 242 (H) 07/18/2019 0811   TRIG 134.0 09/14/2021 0729   HDL 47.70 09/14/2021 0729   HDL 49 07/18/2019 0811   CHOLHDL 3 09/14/2021 0729   VLDL 26.8 09/14/2021 0729   LDLCALC 82 09/14/2021 0729   LDLCALC 66 09/05/2020 0732    Physical Exam:    VS:  BP (!) 156/86    Pulse 90    Ht 5' (1.524 m)    Wt 157 lb 3.2 oz (71.3 kg)    SpO2 99%    BMI 30.70 kg/m     Wt Readings from Last 3 Encounters:  12/09/21 157 lb 3.2 oz (71.3 kg)  09/14/21 153 lb 4 oz (69.5 kg)  03/13/21 155 lb (70.3 kg)     GEN: Patient is in no acute  distress HEENT: Normal NECK: No JVD; No carotid bruits LYMPHATICS: No lymphadenopathy CARDIAC: Hear sounds regular, 2/6 systolic murmur at the apex. RESPIRATORY:  Clear to auscultation without rales, wheezing or rhonchi  ABDOMEN: Soft, non-tender, non-distended MUSCULOSKELETAL:  No edema; No deformity  SKIN: Warm and dry NEUROLOGIC:  Alert and oriented x 3 PSYCHIATRIC:  Normal affect   Signed, Garwin Brothers, MD  12/09/2021 1:38 PM    [facility]

## 2021-12-23 ENCOUNTER — Ambulatory Visit (INDEPENDENT_AMBULATORY_CARE_PROVIDER_SITE_OTHER): Payer: 59

## 2021-12-23 ENCOUNTER — Other Ambulatory Visit: Payer: Self-pay

## 2021-12-23 DIAGNOSIS — R011 Cardiac murmur, unspecified: Secondary | ICD-10-CM | POA: Diagnosis not present

## 2021-12-23 DIAGNOSIS — Z8774 Personal history of (corrected) congenital malformations of heart and circulatory system: Secondary | ICD-10-CM | POA: Diagnosis not present

## 2021-12-23 DIAGNOSIS — E1165 Type 2 diabetes mellitus with hyperglycemia: Secondary | ICD-10-CM | POA: Diagnosis not present

## 2021-12-23 DIAGNOSIS — E785 Hyperlipidemia, unspecified: Secondary | ICD-10-CM | POA: Diagnosis not present

## 2021-12-23 DIAGNOSIS — I10 Essential (primary) hypertension: Secondary | ICD-10-CM | POA: Diagnosis not present

## 2021-12-23 LAB — ECHOCARDIOGRAM COMPLETE
Area-P 1/2: 3.99 cm2
P 1/2 time: 628 msec
S' Lateral: 1.9 cm

## 2021-12-24 LAB — BASIC METABOLIC PANEL
BUN/Creatinine Ratio: 12 (ref 9–20)
BUN: 11 mg/dL (ref 6–24)
CO2: 26 mmol/L (ref 20–29)
Calcium: 9.3 mg/dL (ref 8.7–10.2)
Chloride: 101 mmol/L (ref 96–106)
Creatinine, Ser: 0.93 mg/dL (ref 0.76–1.27)
Glucose: 120 mg/dL — ABNORMAL HIGH (ref 70–99)
Potassium: 4.4 mmol/L (ref 3.5–5.2)
Sodium: 139 mmol/L (ref 134–144)
eGFR: 95 mL/min/{1.73_m2} (ref >59)

## 2022-01-07 ENCOUNTER — Other Ambulatory Visit: Payer: Self-pay

## 2022-01-07 MED ORDER — METOPROLOL SUCCINATE ER 50 MG PO TB24: 50.0000 mg | ORAL_TABLET | Freq: Every morning | ORAL | 0 refills | Status: DC

## 2022-01-07 MED ORDER — METOPROLOL SUCCINATE ER 50 MG PO TB24: 50.0000 mg | ORAL_TABLET | Freq: Every day | ORAL | 0 refills | Status: DC

## 2022-01-07 NOTE — Progress Notes (Signed)
Dr. Tomie China told me to send in Toprol XL 50 mg daily for the patient. ?

## 2022-03-15 ENCOUNTER — Ambulatory Visit (INDEPENDENT_AMBULATORY_CARE_PROVIDER_SITE_OTHER): Payer: 59 | Admitting: Family Medicine

## 2022-03-15 ENCOUNTER — Encounter: Payer: Self-pay | Admitting: Family Medicine

## 2022-03-15 VITALS — BP 138/88 | HR 67 | Temp 97.7°F | Ht 64.0 in | Wt 159.0 lb

## 2022-03-15 DIAGNOSIS — E039 Hypothyroidism, unspecified: Secondary | ICD-10-CM | POA: Diagnosis not present

## 2022-03-15 DIAGNOSIS — E559 Vitamin D deficiency, unspecified: Secondary | ICD-10-CM

## 2022-03-15 DIAGNOSIS — I10 Essential (primary) hypertension: Secondary | ICD-10-CM

## 2022-03-15 DIAGNOSIS — E1165 Type 2 diabetes mellitus with hyperglycemia: Secondary | ICD-10-CM | POA: Diagnosis not present

## 2022-03-15 MED ORDER — LISINOPRIL 20 MG PO TABS: 20.0000 mg | ORAL_TABLET | Freq: Every day | ORAL | 3 refills | Status: DC

## 2022-03-15 NOTE — Patient Instructions (Addendum)
Give [name] 2-3 business days to get the results of your labs back.  ? ?Keep the diet clean and stay active. ? ?Let's leave your blood pressure medication alone for now unless things don't continue to come down.  ? ?Dr Chales Abrahams: [phone] ? ?Let us know if you need anything. ?

## 2022-03-15 NOTE — Progress Notes (Signed)
Subjective:  ? ?Chief Complaint  ?Patient presents with  ? Follow-up  ? ? ?Troy Valentine is a 61 y.o. male here for follow-up of diabetes.   ?Troy Valentine does not routinely check his sugars at home.  ?Patient does not require insulin.   ?Medications include: metformin XR 500 mg/d ?Diet is healthy.  ?Exercise: walking ? ?Hypothyroidism ?Patient presents for follow-up of hypothyroidism.  ?Reports compliance with medication- levothyroxine 88 mcg/d. ?Current symptoms include: denies fatigue, weight changes, heat/cold intolerance, bowel/skin changes or CVS symptoms ?He believes his dose should be not significantly changed ? ?Past Medical History:  ?Diagnosis Date  ? Allergy   ? Diabetes mellitus due to underlying condition with unspecified complications (HCC) 09/18/2018  ? Diabetes mellitus without complication (HCC)   ? Encounter for long-term (current) use of other medications 09/13/2012  ? Essential hypertension 09/13/2012  ? Gastroesophageal reflux disease without esophagitis 02/19/2016  ? Heart murmur   ? History of aortic coarctation repair 02/19/2016  ? Hyperlipidemia LDL goal <100 09/13/2012  ? Hypertension   ? Hypothyroidism 09/13/2012  ? Need for hepatitis C screening test 06/15/2017  ? Patient declines this screening  Formatting of this note might be different from the original. Patient declines this screening  ? Thyroid disease   ? Type 2 diabetes mellitus with hyperglycemia, without long-term current use of insulin (HCC) 09/13/2012  ?  ? ?Related testing: ?Retinal exam: Done ?Pneumovax: done ? ?Objective:  ?BP 138/88   Pulse 67   Temp 97.7 ?F (36.5 ?C) (Oral)   Ht 5\' 4"  (1.626 m)   Wt 159 lb (72.1 kg)   SpO2 99%   BMI 27.29 kg/m?  ?General:  Well developed, well nourished, in no apparent distress ?Skin:  Warm, no pallor or diaphoresis ?Lungs:  CTAB, no access msc use ?Cardio:  RRR, no bruits, no LE edema ?Psych: Age appropriate judgment and insight ? ?Assessment:  ? ?Type 2 diabetes mellitus with  hyperglycemia, without long-term current use of insulin (HCC) - Plan: Comprehensive metabolic panel, Hemoglobin A1c, Lipid panel, CANCELED: Lipid panel, CANCELED: Hemoglobin A1c, CANCELED: Comprehensive metabolic panel ? ?Hypothyroidism, unspecified type - Plan: TSH, CANCELED: TSH ? ?Essential hypertension ? ?Vitamin D deficiency disease - Plan: VITAMIN D 25 Hydroxy (Vit-D Deficiency, Fractures)  ? ?Plan:  ? ?Chronic, stable.  Continue metformin XR 500 mg daily.  Does not need to monitor sugars at home.  Counseled on diet and exercise. ?Chronic, stable.  Continue levothyroxine 88 mcg daily. ?Chronic, seems to be stable.  He has been losing weight and his blood pressures have been coming down since his cardiologist added metoprolol.  He will continue lisinopril 20 mg daily and Toprol-XL 50 mg daily while monitoring his blood pressure over the next month.  If it continues to go down, no changes, but if it stays higher, will consider increasing Toprol to 100 mg daily. ?Monitor. ?F/u in 6 mo. ?The patient voiced understanding and agreement to the plan. ? ? , DO ?03/15/22 ?8:48 AM ? ?

## 2022-03-16 LAB — COMPREHENSIVE METABOLIC PANEL
ALT: 15 IU/L (ref 0–44)
AST: 14 IU/L (ref 0–40)
Albumin/Globulin Ratio: 2 (ref 1.2–2.2)
Albumin: 4.4 g/dL (ref 3.8–4.9)
Alkaline Phosphatase: 69 IU/L (ref 44–121)
BUN/Creatinine Ratio: 14 (ref 9–20)
BUN: 13 mg/dL (ref 6–24)
Bilirubin Total: 0.8 mg/dL (ref 0.0–1.2)
CO2: 25 mmol/L (ref 20–29)
Calcium: 9.4 mg/dL (ref 8.7–10.2)
Chloride: 102 mmol/L (ref 96–106)
Creatinine, Ser: 0.93 mg/dL (ref 0.76–1.27)
Globulin, Total: 2.2 g/dL (ref 1.5–4.5)
Glucose: 127 mg/dL — ABNORMAL HIGH (ref 70–99)
Potassium: 4.1 mmol/L (ref 3.5–5.2)
Sodium: 141 mmol/L (ref 134–144)
Total Protein: 6.6 g/dL (ref 6.0–8.5)
eGFR: 95 mL/min/{1.73_m2} (ref >59)

## 2022-03-16 LAB — LIPID PANEL
Chol/HDL Ratio: 3.2 ratio (ref 0.0–5.0)
Cholesterol, Total: 143 mg/dL (ref 100–199)
HDL: 45 mg/dL (ref >39)
LDL Chol Calc (NIH): 74 mg/dL (ref 0–99)
Triglycerides: 137 mg/dL (ref 0–149)
VLDL Cholesterol Cal: 24 mg/dL (ref 5–40)

## 2022-03-16 LAB — TSH: TSH: 1.41 u[IU]/mL (ref 0.450–4.500)

## 2022-03-16 LAB — HEMOGLOBIN A1C
Est. average glucose Bld gHb Est-mCnc: 126 mg/dL
Hgb A1c MFr Bld: 6 % — ABNORMAL HIGH (ref 4.8–5.6)

## 2022-03-16 LAB — VITAMIN D 25 HYDROXY (VIT D DEFICIENCY, FRACTURES): Vit D, 25-Hydroxy: 57.9 ng/mL (ref 30.0–100.0)

## 2022-04-04 ENCOUNTER — Other Ambulatory Visit: Payer: Self-pay | Admitting: Cardiology

## 2022-06-08 ENCOUNTER — Encounter: Payer: Self-pay | Admitting: Cardiology

## 2022-06-08 ENCOUNTER — Ambulatory Visit (INDEPENDENT_AMBULATORY_CARE_PROVIDER_SITE_OTHER): Payer: 59 | Admitting: Cardiology

## 2022-06-08 VITALS — BP 162/96 | HR 68 | Ht 64.0 in | Wt 161.4 lb

## 2022-06-08 DIAGNOSIS — E785 Hyperlipidemia, unspecified: Secondary | ICD-10-CM

## 2022-06-08 DIAGNOSIS — E088 Diabetes mellitus due to underlying condition with unspecified complications: Secondary | ICD-10-CM

## 2022-06-08 DIAGNOSIS — Z8774 Personal history of (corrected) congenital malformations of heart and circulatory system: Secondary | ICD-10-CM | POA: Diagnosis not present

## 2022-06-08 DIAGNOSIS — I10 Essential (primary) hypertension: Secondary | ICD-10-CM | POA: Diagnosis not present

## 2022-06-08 MED ORDER — ATORVASTATIN CALCIUM 20 MG PO TABS: 20.0000 mg | ORAL_TABLET | Freq: Every day | ORAL | 2 refills | Status: DC

## 2022-06-08 NOTE — Patient Instructions (Signed)

## 2022-06-08 NOTE — Progress Notes (Signed)
Cardiology Office Note:    Date:  06/08/2022   ID:  Troy Valentine, DOB 25-Dec-1960, MRN [id number]  PCP:  Sharlene Dory, DO  Cardiologist:  Garwin Brothers, MD   Referring MD: Sharlene Dory*    ASSESSMENT:    1. Essential hypertension   2. Hyperlipidemia LDL goal <100   3. Diabetes mellitus due to underlying condition with unspecified complications (HCC)   4. History of aortic coarctation repair    PLAN:    In order of problems listed above:  Primary prevention stressed with the patient.  Importance of compliance with diet medication stressed any vocalized understanding. Essential hypertension: Blood pressure stable and diet was emphasized.  He has an element of whitecoat hypertension.  He will keep a track of his blood pressures at home.  I told him that if his systolic is more than 140 consistently after taking medications then he should increase lisinopril to 30 mg daily.  He will keep a track of it and let us know. Mixed dyslipidemia: Diet was emphasized and lifestyle modification urged.  Lipids were reviewed and they are fine. Post coarctation of the aorta repair: Stable by previous imaging. Patient will be seen in follow-up appointment in 9 months or earlier if the patient has any concerns    Medication Adjustments/Labs and Tests Ordered: Current medicines are reviewed at length with the patient today.  Concerns regarding medicines are outlined above.  No orders of the defined types were placed in this encounter.  No orders of the defined types were placed in this encounter.    No chief complaint on file.    History of Present Illness:    Troy Valentine is a 61 y.o. male.  Patient has past medical history of essential hypertension, dyslipidemia, diabetes mellitus and repair of coarctation of aorta at young age.  He denies any problems at this time and takes care of activities of daily living.  No chest pain orthopnea or PND.  At the time  of my evaluation, the patient is alert awake oriented and in no distress.  Past Medical History:  Diagnosis Date   Allergy    Cardiac murmur 12/09/2021   Diabetes mellitus due to underlying condition with unspecified complications (HCC) 09/18/2018   Diabetes mellitus without complication (HCC)    Encounter for long-term (current) use of other medications 09/13/2012   Essential hypertension 09/13/2012   Gastroesophageal reflux disease without esophagitis 02/19/2016   Heart murmur    History of aortic coarctation repair 02/19/2016   Hyperlipidemia LDL goal <100 09/13/2012   Hypertension    Hypothyroidism 09/13/2012   Need for hepatitis C screening test 06/15/2017   Patient declines this screening  Formatting of this note might be different from the original. Patient declines this screening   Thyroid disease    Type 2 diabetes mellitus with hyperglycemia, without long-term current use of insulin (HCC) 09/13/2012    Past Surgical History:  Procedure Laterality Date   COARCTATION OF AORTA EXCISION      Current Medications: Current Meds  Medication Sig   aspirin 81 MG tablet Take 81 mg by mouth daily.   atorvastatin (LIPITOR) 20 MG tablet Take 20 mg by mouth daily.   levothyroxine (SYNTHROID) 88 MCG tablet Take 1 tablet (88 mcg total) by mouth daily before breakfast.   lisinopril (ZESTRIL) 20 MG tablet Take 1 tablet (20 mg total) by mouth daily.   metFORMIN (GLUCOPHAGE-XR) 500 MG 24 hr tablet Take 1 tablet (500 mg total) by mouth  daily with breakfast.   metoprolol succinate (TOPROL-XL) 50 MG 24 hr tablet TAKE 1 TABLET BY MOUTH DAILY. TAKE WITH OR IMMEDIATELY FOLLOWING A MEAL.     Allergies:   Patient has no known allergies.   Social History   Socioeconomic History   Marital status: Married    Spouse name: Not on file   Number of children: Not on file   Years of education: Not on file   Highest education level: Not on file  Occupational History   Not on file  Tobacco Use   Smoking  status: Never   Smokeless tobacco: Never  Substance and Sexual Activity   Alcohol use: No   Drug use: No   Sexual activity: Yes    Birth control/protection: None  Other Topics Concern   Not on file  Social History Narrative   Not on file   Social Determinants of Health   Financial Resource Strain: Not on file  Food Insecurity: Not on file  Transportation Needs: Not on file  Physical Activity: Not on file  Stress: Not on file  Social Connections: Not on file     Family History: The patient's family history includes Hypertension in his mother; Kidney disease in his mother.  ROS:   Please see the history of present illness.    All other systems reviewed and are negative.  EKGs/Labs/Other Studies Reviewed:    The following studies were reviewed today: EKG reveals sinus rhythm and nonspecific ST-T changes   Recent Labs: 09/14/2021: Hemoglobin 15.2; Platelets 219.0 03/15/2022: ALT 15; BUN 13; Creatinine, Ser 0.93; Potassium 4.1; Sodium 141; TSH 1.410  Recent Lipid Panel    Component Value Date/Time   CHOL 143 03/15/2022 0759   TRIG 137 03/15/2022 0759   HDL 45 03/15/2022 0759   CHOLHDL 3.2 03/15/2022 0759   CHOLHDL 3 09/14/2021 0729   VLDL 26.8 09/14/2021 0729   LDLCALC 74 03/15/2022 0759   LDLCALC 66 09/05/2020 0732    Physical Exam:    VS:  BP (!) 162/96   Pulse 68   Ht 5\' 4"  (1.626 m)   Wt 161 lb 6.4 oz (73.2 kg)   SpO2 98%   BMI 27.70 kg/m     Wt Readings from Last 3 Encounters:  06/08/22 161 lb 6.4 oz (73.2 kg)  03/15/22 159 lb (72.1 kg)  12/09/21 157 lb 3.2 oz (71.3 kg)     GEN: Patient is in no acute distress HEENT: Normal NECK: No JVD; No carotid bruits LYMPHATICS: No lymphadenopathy CARDIAC: Hear sounds regular, 2/6 systolic murmur at the apex. RESPIRATORY:  Clear to auscultation without rales, wheezing or rhonchi  ABDOMEN: Soft, non-tender, non-distended MUSCULOSKELETAL:  No edema; No deformity  SKIN: Warm and dry NEUROLOGIC:  Alert and  oriented x 3 PSYCHIATRIC:  Normal affect   Signed, 02/06/22, MD  06/08/2022 3:12 PM    [facility]

## 2022-09-15 ENCOUNTER — Encounter: Payer: Self-pay | Admitting: Family Medicine

## 2022-09-15 ENCOUNTER — Ambulatory Visit (INDEPENDENT_AMBULATORY_CARE_PROVIDER_SITE_OTHER): Payer: 59 | Admitting: Family Medicine

## 2022-09-15 VITALS — BP 130/80 | HR 64 | Temp 98.0°F | Ht 64.0 in | Wt 158.4 lb

## 2022-09-15 DIAGNOSIS — Z0000 Encounter for general adult medical examination without abnormal findings: Secondary | ICD-10-CM

## 2022-09-15 DIAGNOSIS — Z125 Encounter for screening for malignant neoplasm of prostate: Secondary | ICD-10-CM | POA: Diagnosis not present

## 2022-09-15 DIAGNOSIS — E1165 Type 2 diabetes mellitus with hyperglycemia: Secondary | ICD-10-CM | POA: Diagnosis not present

## 2022-09-15 DIAGNOSIS — Z1211 Encounter for screening for malignant neoplasm of colon: Secondary | ICD-10-CM | POA: Diagnosis not present

## 2022-09-15 NOTE — Patient Instructions (Addendum)
Give [name] 2-3 business days to get the results of your labs back.   Keep the diet clean and stay active.  Please get me a copy of your advanced directive form at your convenience.   The Shingrix vaccine (for shingles) is a 2 shot series spaced 2-6 months apart. It can make people feel low energy, achy and almost like they have the flu for 48 hours after injection. 1/5 people can have nausea and/or vomiting. Please plan accordingly when deciding on when to get this shot. Call our office for a nurse visit appointment to get this. The second shot of the series is less severe regarding the side effects, but it still lasts 48 hours.   If you do not hear anything about your referral in the next 1-2 weeks, call our office and ask for an update.   Please have your eye doc send [name] a copy of your office.   Let us know if you need anything.

## 2022-09-15 NOTE — Progress Notes (Signed)
Chief Complaint  Patient presents with   Annual Exam    Well Male Troy Valentine is here for a complete physical.   His last physical was >1 year ago.  Current diet: in general, a "healthy" diet.  Current exercise: some walking Weight trend: stable Fatigue out of ordinary? No. Seat belt? Yes.   Advanced directive? No  Health maintenance Shingrix- No Colonoscopy- Yes Tetanus- Yes HIV- Yes Hep C- Yes   Past Medical History:  Diagnosis Date   Allergy    Cardiac murmur 12/09/2021   Diabetes mellitus due to underlying condition with unspecified complications (HCC) 09/18/2018   Diabetes mellitus without complication (HCC)    Encounter for long-term (current) use of other medications 09/13/2012   Essential hypertension 09/13/2012   Gastroesophageal reflux disease without esophagitis 02/19/2016   Heart murmur    History of aortic coarctation repair 02/19/2016   Hyperlipidemia LDL goal <100 09/13/2012   Hypertension    Hypothyroidism 09/13/2012   Need for hepatitis C screening test 06/15/2017   Patient declines this screening  Formatting of this note might be different from the original. Patient declines this screening   Thyroid disease    Type 2 diabetes mellitus with hyperglycemia, without long-term current use of insulin (HCC) 09/13/2012      Past Surgical History:  Procedure Laterality Date   COARCTATION OF AORTA EXCISION      Medications  Current Outpatient Medications on File Prior to Visit  Medication Sig Dispense Refill   aspirin 81 MG tablet Take 81 mg by mouth daily.     atorvastatin (LIPITOR) 20 MG tablet Take 1 tablet (20 mg total) by mouth daily. 90 tablet 2   fluticasone (FLOVENT HFA) 110 MCG/ACT inhaler Inhale 2 puffs into the lungs in the morning and at bedtime. Rinse mouth out after use. (Patient not taking: Reported on 06/08/2022) 1 each 1   levothyroxine (SYNTHROID) 88 MCG tablet Take 1 tablet (88 mcg total) by mouth daily before breakfast. 90 tablet 3    lisinopril (ZESTRIL) 20 MG tablet Take 1 tablet (20 mg total) by mouth daily. 90 tablet 3   metFORMIN (GLUCOPHAGE-XR) 500 MG 24 hr tablet Take 1 tablet (500 mg total) by mouth daily with breakfast. 90 tablet 3   metoprolol succinate (TOPROL-XL) 50 MG 24 hr tablet TAKE 1 TABLET BY MOUTH DAILY. TAKE WITH OR IMMEDIATELY FOLLOWING A MEAL. 90 tablet 0   Vitamin D, Ergocalciferol, (DRISDOL) 1.25 MG (50000 UNIT) CAPS capsule Take 1 capsule (50,000 Units total) by mouth every 7 (seven) days. (Patient not taking: Reported on 06/08/2022) 12 capsule 0    Allergies No Known Allergies  Family History Family History  Problem Relation Age of Onset   Hypertension Mother    Kidney disease Mother     Review of Systems: Constitutional:  no fevers Eye:  no recent significant change in vision Ear/Nose/Mouth/Throat:  Ears:  no hearing loss Nose/Mouth/Throat:  no complaints of nasal congestion, no sore throat Cardiovascular:  no chest pain Respiratory:  no shortness of breath Gastrointestinal:  no change in bowel habits GU:  Male: negative for dysuria, frequency Musculoskeletal/Extremities:  no joint pain Integumentary (Skin/Breast):  no abnormal skin lesions reported Neurologic:  no headaches Endocrine: No unexpected weight changes Hematologic/Lymphatic:  no abnormal bleeding  Exam BP 130/80 (BP Location: Left Arm, Patient Position: Sitting, Cuff Size: Normal)   Pulse 64   Temp 98 F (36.7 C) (Oral)   Ht 5\' 4"  (1.626 m)   Wt 158 lb 6 oz (  71.8 kg)   SpO2 99%   BMI 27.19 kg/m  General:  well developed, well nourished, in no apparent distress Skin:  no significant moles, warts, or growths Head:  no masses, lesions, or tenderness Eyes:  pupils equal and round, sclera anicteric without injection Ears:  canals without lesions, TMs shiny without retraction, no obvious effusion, no erythema Nose:  nares patent, mucosa normal Throat/Pharynx:  lips and gingiva without lesion; tongue and uvula midline;  non-inflamed pharynx; no exudates or postnasal drainage Neck: neck supple without adenopathy, thyromegaly, or masses Cardiac: RRR, no bruits, no LE edema; DP pulses 2+ b/l Lungs:  clear to auscultation, breath sounds equal bilaterally, no respiratory distress Abdomen: BS+, soft, non-tender, non-distended, no masses or organomegaly noted Rectal: Deferred Musculoskeletal:  symmetrical muscle groups noted without atrophy or deformity Neuro: sensation intact to pinprick over both feet; gait normal; deep tendon reflexes normal and symmetric Psych: well oriented with normal range of affect and appropriate judgment/insight  Assessment and Plan  Well adult exam - Plan: CBC, Comprehensive metabolic panel, Lipid panel  Type 2 diabetes mellitus with hyperglycemia, without long-term current use of insulin (HCC) - Plan: Hemoglobin A1c, Microalbumin / creatinine urine ratio  Screening for prostate cancer - Plan: PSA  Screen for colon cancer - Plan: Ambulatory referral to Gastroenterology   Well 61 y.o. male. Counseled on diet and exercise. Counseled on risks and benefits of prostate cancer screening with PSA. The patient agrees to undergo testing. CCS- he is adamant he will get his colonoscopy done, will refer to Dr. Chales Abrahams.  Advanced directive form provided today.  Will get flu shot w wife at pharmacy.  Immunizations, labs, and further orders as above. Follow up in 6 mo. The patient voiced understanding and agreement to the plan.  Jilda Roche Epps, DO 09/15/22 8:02 AM

## 2022-09-16 LAB — LIPID PANEL
Chol/HDL Ratio: 2.9 ratio (ref 0.0–5.0)
Cholesterol, Total: 139 mg/dL (ref 100–199)
HDL: 48 mg/dL (ref >39)
LDL Chol Calc (NIH): 71 mg/dL (ref 0–99)
Triglycerides: 109 mg/dL (ref 0–149)
VLDL Cholesterol Cal: 20 mg/dL (ref 5–40)

## 2022-09-16 LAB — HEMOGLOBIN A1C
Est. average glucose Bld gHb Est-mCnc: 126 mg/dL
Hgb A1c MFr Bld: 6 % — ABNORMAL HIGH (ref 4.8–5.6)

## 2022-09-16 LAB — CBC
Hematocrit: 44.8 % (ref 37.5–51.0)
Hemoglobin: 15.7 g/dL (ref 13.0–17.7)
MCH: 29.6 pg (ref 26.6–33.0)
MCHC: 35 g/dL (ref 31.5–35.7)
MCV: 85 fL (ref 79–97)
Platelets: 234 10*3/uL (ref 150–450)
RBC: 5.3 x10E6/uL (ref 4.14–5.80)
RDW: 12.1 % (ref 11.6–15.4)
WBC: 8.1 10*3/uL (ref 3.4–10.8)

## 2022-09-16 LAB — COMPREHENSIVE METABOLIC PANEL
ALT: 19 IU/L (ref 0–44)
AST: 17 IU/L (ref 0–40)
Albumin/Globulin Ratio: 2.1 (ref 1.2–2.2)
Albumin: 4.5 g/dL (ref 3.8–4.9)
Alkaline Phosphatase: 67 IU/L (ref 44–121)
BUN/Creatinine Ratio: 13 (ref 9–20)
BUN: 12 mg/dL (ref 6–24)
Bilirubin Total: 1 mg/dL (ref 0.0–1.2)
CO2: 24 mmol/L (ref 20–29)
Calcium: 9.2 mg/dL (ref 8.7–10.2)
Chloride: 102 mmol/L (ref 96–106)
Creatinine, Ser: 0.92 mg/dL (ref 0.76–1.27)
Globulin, Total: 2.1 g/dL (ref 1.5–4.5)
Glucose: 109 mg/dL — ABNORMAL HIGH (ref 70–99)
Potassium: 4.1 mmol/L (ref 3.5–5.2)
Sodium: 140 mmol/L (ref 134–144)
Total Protein: 6.6 g/dL (ref 6.0–8.5)
eGFR: 96 mL/min/{1.73_m2} (ref >59)

## 2022-09-16 LAB — PSA: Prostate Specific Ag, Serum: 0.3 ng/mL (ref 0.0–4.0)

## 2022-09-17 LAB — MICROALBUMIN / CREATININE URINE RATIO
Creatinine, Urine: 114.4 mg/dL
Microalb/Creat Ratio: 4 mg/g creat (ref 0–29)
Microalbumin, Urine: 4.9 ug/mL

## 2022-09-27 ENCOUNTER — Other Ambulatory Visit: Payer: Self-pay

## 2022-09-27 MED ORDER — METOPROLOL SUCCINATE ER 50 MG PO TB24: 50.0000 mg | ORAL_TABLET | Freq: Every day | ORAL | 2 refills | Status: DC

## 2022-12-28 ENCOUNTER — Telehealth: Payer: Self-pay | Admitting: Family Medicine

## 2022-12-28 MED ORDER — METFORMIN HCL ER 500 MG PO TB24: 500.0000 mg | ORAL_TABLET | Freq: Every day | ORAL | 3 refills | Status: DC

## 2022-12-28 MED ORDER — LEVOTHYROXINE SODIUM 88 MCG PO TABS: 88.0000 ug | ORAL_TABLET | Freq: Every day | ORAL | 3 refills | Status: DC

## 2022-12-28 NOTE — Telephone Encounter (Signed)
Prescription Request  12/28/2022  Is this a "Controlled Substance" medicine? No  LOV: 09/15/2022  What is the name of the medication or equipment? levothyroxine (SYNTHROID) 88 MCG tablet   metFORMIN (GLUCOPHAGE-XR) 500 MG 24 hr tablet   Have you contacted your pharmacy to request a refill? No   Which pharmacy would you like this sent to?  [facility] [address] [place] [address] Phone: [phone] Fax: [phone]      Patient notified that their request is being sent to the clinical staff for review and that they should receive a response within 2 business days.   Please advise at Mobile [phone] (mobile)

## 2022-12-28 NOTE — Telephone Encounter (Signed)
Sent in to mail order. 

## 2023-03-11 ENCOUNTER — Other Ambulatory Visit: Payer: Self-pay | Admitting: Cardiology

## 2023-03-18 ENCOUNTER — Ambulatory Visit (INDEPENDENT_AMBULATORY_CARE_PROVIDER_SITE_OTHER): Payer: 59 | Admitting: Family Medicine

## 2023-03-18 ENCOUNTER — Encounter: Payer: Self-pay | Admitting: Family Medicine

## 2023-03-18 VITALS — BP 134/86 | HR 63 | Temp 98.0°F | Ht 64.0 in | Wt 161.4 lb

## 2023-03-18 DIAGNOSIS — E1165 Type 2 diabetes mellitus with hyperglycemia: Secondary | ICD-10-CM

## 2023-03-18 DIAGNOSIS — I10 Essential (primary) hypertension: Secondary | ICD-10-CM | POA: Diagnosis not present

## 2023-03-18 DIAGNOSIS — E079 Disorder of thyroid, unspecified: Secondary | ICD-10-CM

## 2023-03-18 DIAGNOSIS — E088 Diabetes mellitus due to underlying condition with unspecified complications: Secondary | ICD-10-CM | POA: Diagnosis not present

## 2023-03-18 DIAGNOSIS — Z7984 Long term (current) use of oral hypoglycemic drugs: Secondary | ICD-10-CM

## 2023-03-18 NOTE — Patient Instructions (Addendum)
Give [name] 2-3 business days to get the results of your labs back.   Keep the diet clean and stay active.  Monitor your blood pressure at home over the next month. If >140/90, please let me know on MyChart.   Please contact the GI team at: [phone]  Let us know if you need anything.

## 2023-03-18 NOTE — Progress Notes (Addendum)
Subjective:   Chief Complaint  Patient presents with   Follow-up    6 month     Troy Valentine is a 62 y.o. male here for follow-up of diabetes.   [name] does not monitor his sugars routinely.  He did possibly have an episode of hypoglycemia while on a flight to [place], but has had no other issues in the past 6 months. Patient does not require insulin.   Medications include: Metformin XR 500 mg/d Diet is healthy.  Exercise: walking  Hypertension Patient presents for hypertension follow up. He does monitor home blood pressures. Blood pressures ranging on average from 140's/80's. He is compliant with medications- Toprol XL 50 mg/d, lisinopril 40 mg/d. Patient has these side effects of medication: none Diet/exercise as above.  No Cp or SOB.   Hypothyroidism Patient presents for follow-up of hypothyroidism.  Reports compliance with medication- Synthroid 88 mcg/d. Current symptoms include: denies fatigue, weight changes, heat/cold intolerance, bowel/skin changes or CVS symptoms He believes his dose should be not significantly changed  Past Medical History:  Diagnosis Date   Allergy    Cardiac murmur 12/09/2021   Diabetes mellitus due to underlying condition with unspecified complications (HCC) 09/18/2018   Diabetes mellitus without complication (HCC)    Encounter for long-term (current) use of other medications 09/13/2012   Essential hypertension 09/13/2012   Gastroesophageal reflux disease without esophagitis 02/19/2016   Heart murmur    History of aortic coarctation repair 02/19/2016   Hyperlipidemia LDL goal <100 09/13/2012   Hypertension    Hypothyroidism 09/13/2012   Need for hepatitis C screening test 06/15/2017   Patient declines this screening  Formatting of this note might be different from the original. Patient declines this screening   Thyroid disease    Type 2 diabetes mellitus with hyperglycemia, without long-term current use of insulin (HCC) 09/13/2012      Related testing: Retinal exam: Done Pneumovax: done  Objective:  BP 134/86 (BP Location: Left Arm, Cuff Size: Normal)   Pulse 63   Temp 98 F (36.7 C) (Oral)   Ht 5\' 4"  (1.626 m)   Wt 161 lb 6 oz (73.2 kg)   SpO2 99%   BMI 27.70 kg/m  General:  Well developed, well nourished, in no apparent distress Skin:  Warm, no pallor or diaphoresis on exposed skin Lungs:  CTAB, no access msc use Cardio:  RRR, no bruits, no LE edema Psych: Age appropriate judgment and insight  Assessment:   Type 2 diabetes mellitus with hyperglycemia, without long-term current use of insulin (HCC) - Plan: CBC, Comprehensive metabolic panel, Hemoglobin A1c, Lipid panel  Essential hypertension  Thyroid disease - Plan: TSH, CANCELED: TSH   Plan:   Chronic, stable. Cont Metformin XR 500 mg/d. Counseled on diet and exercise. Chronic, unsure if stable.  Continue Toprol-XL 50 mg daily, lisinopril 40 mg daily.  He will monitor blood pressure at home and if still elevated over the next few weeks, he will let me know.  Would consider adding a thiazide diuretic at that time. Chronic, stable. Ck labs. Cont levothyroxine 88 mcg daily. LBGI info given for CCS.  F/u in 6 mo. The patient voiced understanding and agreement to the plan.  Jilda Roche [place], DO 03/25/23 11:10 AM

## 2023-03-19 LAB — TSH: TSH: 2.07 u[IU]/mL (ref 0.450–4.500)

## 2023-03-19 LAB — COMPREHENSIVE METABOLIC PANEL
ALT: 25 IU/L (ref 0–44)
AST: 18 IU/L (ref 0–40)
Albumin/Globulin Ratio: 2 (ref 1.2–2.2)
Albumin: 4.7 g/dL (ref 3.8–4.9)
Alkaline Phosphatase: 79 IU/L (ref 44–121)
BUN/Creatinine Ratio: 12 (ref 10–24)
BUN: 10 mg/dL (ref 8–27)
Bilirubin Total: 0.8 mg/dL (ref 0.0–1.2)
CO2: 21 mmol/L (ref 20–29)
Calcium: 9.3 mg/dL (ref 8.6–10.2)
Chloride: 103 mmol/L (ref 96–106)
Creatinine, Ser: 0.84 mg/dL (ref 0.76–1.27)
Globulin, Total: 2.3 g/dL (ref 1.5–4.5)
Glucose: 116 mg/dL — ABNORMAL HIGH (ref 70–99)
Potassium: 4.5 mmol/L (ref 3.5–5.2)
Sodium: 140 mmol/L (ref 134–144)
Total Protein: 7 g/dL (ref 6.0–8.5)
eGFR: 100 mL/min/{1.73_m2} (ref >59)

## 2023-03-19 LAB — CBC
Hematocrit: 47.1 % (ref 37.5–51.0)
Hemoglobin: 16.2 g/dL (ref 13.0–17.7)
MCH: 29.6 pg (ref 26.6–33.0)
MCHC: 34.4 g/dL (ref 31.5–35.7)
MCV: 86 fL (ref 79–97)
Platelets: 245 10*3/uL (ref 150–450)
RBC: 5.48 x10E6/uL (ref 4.14–5.80)
RDW: 12.9 % (ref 11.6–15.4)
WBC: 8.1 10*3/uL (ref 3.4–10.8)

## 2023-03-19 LAB — HEMOGLOBIN A1C
Est. average glucose Bld gHb Est-mCnc: 140 mg/dL
Hgb A1c MFr Bld: 6.5 % — ABNORMAL HIGH (ref 4.8–5.6)

## 2023-03-19 LAB — LIPID PANEL
Chol/HDL Ratio: 3.7 ratio (ref 0.0–5.0)
Cholesterol, Total: 178 mg/dL (ref 100–199)
HDL: 48 mg/dL (ref >39)
LDL Chol Calc (NIH): 103 mg/dL — ABNORMAL HIGH (ref 0–99)
Triglycerides: 157 mg/dL — ABNORMAL HIGH (ref 0–149)
VLDL Cholesterol Cal: 27 mg/dL (ref 5–40)

## 2023-06-17 ENCOUNTER — Other Ambulatory Visit: Payer: Self-pay | Admitting: Cardiology

## 2023-06-20 MED ORDER — ATORVASTATIN CALCIUM 20 MG PO TABS: 20.0000 mg | ORAL_TABLET | Freq: Every day | ORAL | 0 refills | Status: DC

## 2023-07-04 ENCOUNTER — Other Ambulatory Visit: Payer: Self-pay | Admitting: Cardiology

## 2023-07-08 ENCOUNTER — Encounter: Payer: Self-pay | Admitting: Cardiology

## 2023-07-08 ENCOUNTER — Ambulatory Visit: Payer: 59 | Attending: Cardiology | Admitting: Cardiology

## 2023-07-08 VITALS — BP 180/100 | HR 64 | Ht 64.0 in | Wt 160.0 lb

## 2023-07-08 DIAGNOSIS — I10 Essential (primary) hypertension: Secondary | ICD-10-CM | POA: Diagnosis not present

## 2023-07-08 DIAGNOSIS — E785 Hyperlipidemia, unspecified: Secondary | ICD-10-CM

## 2023-07-08 DIAGNOSIS — E1165 Type 2 diabetes mellitus with hyperglycemia: Secondary | ICD-10-CM

## 2023-07-08 DIAGNOSIS — Z8774 Personal history of (corrected) congenital malformations of heart and circulatory system: Secondary | ICD-10-CM

## 2023-07-08 DIAGNOSIS — Z7984 Long term (current) use of oral hypoglycemic drugs: Secondary | ICD-10-CM | POA: Diagnosis not present

## 2023-07-08 MED ORDER — ATORVASTATIN CALCIUM 20 MG PO TABS: 20.0000 mg | ORAL_TABLET | Freq: Every day | ORAL | 3 refills | Status: DC

## 2023-07-08 MED ORDER — METOPROLOL SUCCINATE ER 50 MG PO TB24: 50.0000 mg | ORAL_TABLET | Freq: Every day | ORAL | 3 refills | Status: DC

## 2023-07-08 MED ORDER — LISINOPRIL 20 MG PO TABS: 20.0000 mg | ORAL_TABLET | Freq: Every day | ORAL | 3 refills | Status: DC

## 2023-07-08 MED ORDER — HYDROCHLOROTHIAZIDE 12.5 MG PO CAPS: 12.5000 mg | ORAL_CAPSULE | Freq: Every day | ORAL | 0 refills | Status: DC

## 2023-07-08 MED ORDER — HYDROCHLOROTHIAZIDE 12.5 MG PO CAPS: 12.5000 mg | ORAL_CAPSULE | Freq: Every day | ORAL | 3 refills | Status: DC

## 2023-07-08 NOTE — Patient Instructions (Signed)
Please keep a BP log for 2 weeks and send by MyChart or mail.  Blood Pressure Record Sheet To take your blood pressure, you will need a blood pressure machine. You can buy a blood pressure machine (blood pressure monitor) at your clinic, drug store, or online. When choosing one, consider: An automatic monitor that has an arm cuff. A cuff that wraps snugly around your upper arm. You should be able to fit only one finger between your arm and the cuff. A device that stores blood pressure reading results. Do not choose a monitor that measures your blood pressure from your wrist or finger. Follow your health care provider's instructions for how to take your blood pressure. To use this form: Get one reading in the morning (a.m.) 1-2 hours after you take any medicines. Get one reading in the evening (p.m.) before supper.   Blood pressure log Date: _______________________  a.m. _____________________(1st reading) HR___________            p.m. _____________________(2nd reading) HR__________  Date: _______________________  a.m. _____________________(1st reading) HR___________            p.m. _____________________(2nd reading) HR__________  Date: _______________________  a.m. _____________________(1st reading) HR___________            p.m. _____________________(2nd reading) HR__________  Date: _______________________  a.m. _____________________(1st reading) HR___________            p.m. _____________________(2nd reading) HR__________  Date: _______________________  a.m. _____________________(1st reading) HR___________            p.m. _____________________(2nd reading) HR__________  Date: _______________________  a.m. _____________________(1st reading) HR___________            p.m. _____________________(2nd reading) HR__________  Date: _______________________  a.m. _____________________(1st reading) HR___________            p.m. _____________________(2nd reading)  HR__________   This information is not intended to replace advice given to you by your health care provider. Make sure you discuss any questions you have with your health care provider. Document Revised: 02/13/2020 Document Reviewed: 02/13/2020 Elsevier Patient Education  2021 Elsevier Inc.   Medication Instructions:  Your physician has recommended you make the following change in your medication:   Start Hydrochlorothiazide 12.5 mg daily.  *If you need a refill on your cardiac medications before your next appointment, please call your pharmacy*   Lab Work: Your physician recommends that you return for lab work in:1 week for BMP. You need to have labs done when you are fasting. [facility] lab is located on the 3rd floor, [address]. Hours are Monday - Friday 8 am to 4 pm, closed 11:30 am to 1:00 pm. You do NOT need an appointment.   If you have labs (blood work) drawn today and your tests are completely normal, you will receive your results only by: MyChart Message (if you have MyChart) OR A paper copy in the mail If you have any lab test that is abnormal or we need to change your treatment, we will call you to review the results.   Testing/Procedures: None ordered   Follow-Up: At [facility], you and your health needs are our priority.  As part of our continuing mission to provide you with exceptional heart care, we have created designated Provider Care Teams.  These Care Teams include your primary Cardiologist (physician) and Advanced Practice Providers (APPs -  Physician Assistants and Nurse Practitioners) who all work together to provide you with the care you need, when you need it.  We recommend signing  up for the patient portal called "MyChart".  Sign up information is provided on this After Visit Summary.  MyChart is used to connect with patients for Virtual Visits (Telemedicine).  Patients are able to view lab/test results, encounter notes, upcoming appointments, etc.   Non-urgent messages can be sent to your provider as well.   To learn more about what you can do with MyChart, go to [email or web address].    Your next appointment:   9 month(s)  The format for your next appointment:   In Person  Provider:   Belva Crome, MD    Other Instructions none  Important Information About Sugar

## 2023-07-08 NOTE — Progress Notes (Signed)
Cardiology Office Note:    Date:  07/08/2023   ID:  Troy Valentine, [name] 01-04-1961, MRN [id number]  PCP:  Sharlene Dory, DO  Cardiologist:  Garwin Brothers, MD   Referring MD: Sharlene Dory*    ASSESSMENT:    1. Essential hypertension   2. History of aortic coarctation repair   3. Type 2 diabetes mellitus with hyperglycemia, without long-term current use of insulin (HCC)   4. Hyperlipidemia LDL goal <100    PLAN:    In order of problems listed above:  Primary prevention stressed with the patient.  Importance of compliance with diet medication stressed and patient verbalized standing.  He was advised to walk at least half an hour a day 5 days a week promises to do so. Essential hypertension: Blood pressure stable and diet was emphasized.  Lifestyle modification urged.  His blood pressure has consistently been elevated so I added hydrochlorothiazide 12.5 mg to his regimen.  He will be back in 1 week for Chem-7.  At that time we will get a pulse blood pressure log.  Salt intake issues were discussed. Mixed dyslipidemia: On lipid-lowering medications followed by primary care.  Lipids are not at goal he is going to recheck blood work in November.  He was a little bit lax with his diet because of his stress situation. Diabetes mellitus: Under good control and followed by primary care.  Diet discussed. Postrepair of coarctation of aorta: Stable.  MRI 5 years ago was stable and he reviewed this. Patient will be seen in follow-up appointment in 6 months or earlier if the patient has any concerns.    Medication Adjustments/Labs and Tests Ordered: Current medicines are reviewed at length with the patient today.  Concerns regarding medicines are outlined above.  Orders Placed This Encounter  Procedures   EKG 12-Lead   No orders of the defined types were placed in this encounter.    Chief Complaint  Patient presents with   Follow-up     History of Present  Illness:    Troy Valentine is a 62 y.o. male.  Patient has past medical history of repair of coarctation of the aorta, essential hypertension, mixed dyslipidemia and diabetes mellitus.  He mentions to me that he was under significant amount of stress in the past several months.  He denies any chest pain orthopnea or PND.  At the time of my evaluation, the patient is alert awake oriented and in no distress.  Past Medical History:  Diagnosis Date   Allergy    Cardiac murmur 12/09/2021   Encounter for long-term (current) use of other medications 09/13/2012   Essential hypertension 09/13/2012   Gastroesophageal reflux disease without esophagitis 02/19/2016   History of aortic coarctation repair 02/19/2016   Hyperlipidemia LDL goal <100 09/13/2012   Hypothyroidism 09/13/2012   Type 2 diabetes mellitus with hyperglycemia, without long-term current use of insulin (HCC) 09/13/2012    Past Surgical History:  Procedure Laterality Date   COARCTATION OF AORTA EXCISION      Current Medications: Current Meds  Medication Sig   aspirin 81 MG tablet Take 81 mg by mouth daily.   atorvastatin (LIPITOR) 20 MG tablet Take 1 tablet (20 mg total) by mouth daily. Patient must keep appointment for 07/08/23 for further refills. 1 st attempt   levothyroxine (SYNTHROID) 88 MCG tablet Take 1 tablet (88 mcg total) by mouth daily before breakfast.   lisinopril (ZESTRIL) 20 MG tablet Take 1 tablet (20 mg total) by mouth daily.  metFORMIN (GLUCOPHAGE-XR) 500 MG 24 hr tablet Take 1 tablet (500 mg total) by mouth daily with breakfast.   metoprolol succinate (TOPROL-XL) 50 MG 24 hr tablet TAKE 1 TABLET BY MOUTH EVERY DAY     Allergies:   Patient has no known allergies.   Social History   Socioeconomic History   Marital status: Married    Spouse name: Not on file   Number of children: Not on file   Years of education: Not on file   Highest education level: Not on file  Occupational History   Not on file   Tobacco Use   Smoking status: Never   Smokeless tobacco: Never  Substance and Sexual Activity   Alcohol use: No   Drug use: No   Sexual activity: Yes    Birth control/protection: None  Other Topics Concern   Not on file  Social History Narrative   Not on file   Social Determinants of Health   Financial Resource Strain: Not on file  Food Insecurity: Not on file  Transportation Needs: Not on file  Physical Activity: Not on file  Stress: Not on file  Social Connections: Not on file     Family History: The patient's family history includes Hypertension in his mother; Kidney disease in his mother.  ROS:   Please see the history of present illness.    All other systems reviewed and are negative.  EKGs/Labs/Other Studies Reviewed:    The following studies were reviewed today: .Marland KitchenEKG Interpretation Date/Time:  Friday July 08 2023 08:02:20 EDT Ventricular Rate:  64 PR Interval:  130 QRS Duration:  82 QT Interval:  418 QTC Calculation: 431 R Axis:   21  Text Interpretation: Normal sinus rhythm Possible Left atrial enlargement Nonspecific T wave abnormality No previous ECGs available Confirmed by Belva Crome [phone]) on 07/08/2023 8:34:26 AM     Recent Labs: 03/18/2023: ALT 25; BUN 10; Creatinine, Ser 0.84; Hemoglobin 16.2; Platelets 245; Potassium 4.5; Sodium 140; TSH 2.070  Recent Lipid Panel    Component Value Date/Time   CHOL 178 03/18/2023 0752   TRIG 157 (H) 03/18/2023 0752   HDL 48 03/18/2023 0752   CHOLHDL 3.7 03/18/2023 0752   CHOLHDL 3 09/14/2021 0729   VLDL 26.8 09/14/2021 0729   LDLCALC 103 (H) 03/18/2023 0752   LDLCALC 66 09/05/2020 0732    Physical Exam:    VS:  BP (!) 170/90 (BP Location: Left Arm, Patient Position: Sitting, Cuff Size: Normal)   Pulse 64   Ht 5\' 4"  (1.626 m)   Wt 160 lb (72.6 kg)   SpO2 96%   BMI 27.46 kg/m     Wt Readings from Last 3 Encounters:  07/08/23 160 lb (72.6 kg)  03/18/23 161 lb 6 oz (73.2 kg)  09/15/22 158  lb 6 oz (71.8 kg)     GEN: Patient is in no acute distress HEENT: Normal NECK: No JVD; No carotid bruits LYMPHATICS: No lymphadenopathy CARDIAC: Hear sounds regular, 2/6 systolic murmur at the apex. RESPIRATORY:  Clear to auscultation without rales, wheezing or rhonchi  ABDOMEN: Soft, non-tender, non-distended MUSCULOSKELETAL:  No edema; No deformity  SKIN: Warm and dry NEUROLOGIC:  Alert and oriented x 3 PSYCHIATRIC:  Normal affect   Signed, Garwin Brothers, MD  07/08/2023 8:37 AM    [facility]

## 2023-07-12 ENCOUNTER — Other Ambulatory Visit: Payer: Self-pay | Admitting: Family Medicine

## 2023-07-12 ENCOUNTER — Telehealth (INDEPENDENT_AMBULATORY_CARE_PROVIDER_SITE_OTHER): Payer: 59 | Admitting: Family Medicine

## 2023-07-12 ENCOUNTER — Encounter: Payer: Self-pay | Admitting: Family Medicine

## 2023-07-12 DIAGNOSIS — U071 COVID-19: Secondary | ICD-10-CM

## 2023-07-12 HISTORY — DX: COVID-19: U07.1

## 2023-07-12 MED ORDER — NIRMATRELVIR/RITONAVIR (PAXLOVID)TABLET: 3.0000 | ORAL_TABLET | Freq: Two times a day (BID) | ORAL | 0 refills | Status: AC

## 2023-07-12 NOTE — Assessment & Plan Note (Signed)
Paxlovid sent in  Return to office as needed

## 2023-07-12 NOTE — Progress Notes (Signed)
MyChart Video Visit    Virtual Visit via Video Note   This patient is at least at moderate risk for complications without adequate follow up. This format is felt to be most appropriate for this patient at this time. Physical exam was limited by quality  the video and audio technology used for the visit. heather was able to get the patient set up on a video visit.  Patient location: home  Patient and provider in visit Provider location: Office  I discussed the limitations of evaluation and management by telemedicine and the availability of in person appointments. The patient expressed understanding and agreed to proceed.  Visit Date: 07/12/2023  Today's healthcare provider: Donato Schultz, DO     Subjective:    Patient ID: Troy Valentine, male    DOB: 01/23/1961, 62 y.o.   MRN: [id number]  Chief Complaint  Patient presents with   Covid Positive    HPI Patient is in today for + covid ---  Discussed the use of AI scribe software for clinical note transcription with the patient, who gave verbal consent to proceed.  History of Present Illness   The patient, with a history of hypertension and bronchitis that frequently progressed to pneumonia, presents after testing positive for COVID-19. He was exposed at a patient retreat five days prior and began experiencing symptoms the following day. Symptoms include cold-like symptoms, fever up to 101 degrees, throat pain, general weakness, and headache. He has been managing the fever with Tylenol and report an oxygen saturation of 96-98%. He also note a heart rate of 80-100 beats per minute.  In addition to his COVID-19 symptoms, the patient has been managing hypertension with metoprolol and recently added hydrochlorothiazide (HCTZ) to his regimen. He report a blood pressure of 145/84-85, which he attribute to his current illness.       Past Medical History:  Diagnosis Date   Allergy    Cardiac murmur 12/09/2021   Encounter  for long-term (current) use of other medications 09/13/2012   Essential hypertension 09/13/2012   Gastroesophageal reflux disease without esophagitis 02/19/2016   History of aortic coarctation repair 02/19/2016   Hyperlipidemia LDL goal <100 09/13/2012   Hypothyroidism 09/13/2012   Type 2 diabetes mellitus with hyperglycemia, without long-term current use of insulin (HCC) 09/13/2012    Past Surgical History:  Procedure Laterality Date   COARCTATION OF AORTA EXCISION      Family History  Problem Relation Age of Onset   Hypertension Mother    Kidney disease Mother     Social History   Socioeconomic History   Marital status: Married    Spouse name: Not on file   Number of children: Not on file   Years of education: Not on file   Highest education level: Not on file  Occupational History   Not on file  Tobacco Use   Smoking status: Never   Smokeless tobacco: Never  Substance and Sexual Activity   Alcohol use: No   Drug use: No   Sexual activity: Yes    Birth control/protection: None  Other Topics Concern   Not on file  Social History Narrative   Not on file   Social Determinants of Health   Financial Resource Strain: Not on file  Food Insecurity: Not on file  Transportation Needs: Not on file  Physical Activity: Not on file  Stress: Not on file  Social Connections: Not on file  Intimate Partner Violence: Not on file    Outpatient  Medications Prior to Visit  Medication Sig Dispense Refill   aspirin 81 MG tablet Take 81 mg by mouth daily.     atorvastatin (LIPITOR) 20 MG tablet Take 1 tablet (20 mg total) by mouth daily. 90 tablet 3   hydrochlorothiazide (MICROZIDE) 12.5 MG capsule Take 1 capsule (12.5 mg total) by mouth daily. 90 capsule 3   levothyroxine (SYNTHROID) 88 MCG tablet Take 1 tablet (88 mcg total) by mouth daily before breakfast. 90 tablet 3   lisinopril (ZESTRIL) 20 MG tablet Take 1 tablet (20 mg total) by mouth daily. 90 tablet 3   metFORMIN  (GLUCOPHAGE-XR) 500 MG 24 hr tablet Take 1 tablet (500 mg total) by mouth daily with breakfast. 90 tablet 3   metoprolol succinate (TOPROL-XL) 50 MG 24 hr tablet Take 1 tablet (50 mg total) by mouth daily. 90 tablet 3   No facility-administered medications prior to visit.    No Known Allergies  Review of Systems  Constitutional:  Positive for chills, fever and malaise/fatigue.  HENT:  Negative for congestion.   Eyes:  Negative for blurred vision.  Respiratory:  Negative for shortness of breath.   Cardiovascular:  Negative for chest pain, palpitations and leg swelling.  Gastrointestinal:  Negative for abdominal pain, blood in stool and nausea.  Genitourinary:  Negative for dysuria and frequency.  Musculoskeletal:  Negative for falls.  Skin:  Negative for rash.  Neurological:  Negative for dizziness, loss of consciousness and headaches.  Endo/Heme/Allergies:  Negative for environmental allergies.  Psychiatric/Behavioral:  Negative for depression. The patient is not nervous/anxious.        Objective:    Physical Exam Vitals and nursing note reviewed.  Pulmonary:     Effort: Pulmonary effort is normal.     There were no vitals taken for this visit. Wt Readings from Last 3 Encounters:  07/08/23 160 lb (72.6 kg)  03/18/23 161 lb 6 oz (73.2 kg)  09/15/22 158 lb 6 oz (71.8 kg)       Assessment & Plan:  COVID-19 Assessment & Plan: Paxlovid sent in  Return to office as needed     Assessment and Plan    COVID-19 Positive test with symptoms of fever, cold, sniffles, throat pain, general weakness, and headache. Oxygen saturation 96-98%. History of bronchitis and pneumonia. -Prescribe Paxlovid. -Advise over-the-counter Tylenol or ibuprofen for fever. -Recommend Delsym, Mucinex, or Robitussin DM if cough worsens. -Advise rest and hydration. -Call if symptoms do not improve.  Hypertension Recent addition of HCTZ to metoprolol. Current blood pressure 145/84-85, possibly  elevated due to illness. -Continue metoprolol and HCTZ. -Monitor blood pressure.        I discussed the assessment and treatment plan with the patient. The patient was provided an opportunity to ask questions and all were answered. The patient agreed with the plan and demonstrated an understanding of the instructions.   The patient was advised to call back or seek an in-person evaluation if the symptoms worsen or if the condition fails to improve as anticipated.  Donato Schultz, DO [place] [facility] [phone] (phone) [phone] (fax)  [facility]

## 2023-12-21 ENCOUNTER — Other Ambulatory Visit: Payer: Self-pay

## 2023-12-21 ENCOUNTER — Other Ambulatory Visit: Payer: Self-pay | Admitting: Family Medicine

## 2023-12-21 MED ORDER — LEVOTHYROXINE SODIUM 88 MCG PO TABS: 88.0000 ug | ORAL_TABLET | Freq: Every day | ORAL | 3 refills | Status: DC

## 2023-12-21 MED ORDER — LISINOPRIL 20 MG PO TABS: 20.0000 mg | ORAL_TABLET | Freq: Every day | ORAL | 3 refills | Status: DC

## 2023-12-21 NOTE — Telephone Encounter (Signed)
Copied from CRM [id number]. Topic: Clinical - Medication Refill >> Dec 21, 2023  8:19 AM Prudencio Pair wrote: Most Recent Primary Care Visit:  Provider: Seabron Spates R  Department: [facility]  Visit Type: Earleen Reaper VIDEO VISIT  Date: 07/12/2023  Medication: levothyroxine (SYNTHROID) 88 MCG tablet & lisinopril (ZESTRIL) 20 MG tablet  Has the patient contacted their pharmacy? No (Agent: If no, request that the patient contact the pharmacy for the refill. If patient does not wish to contact the pharmacy document the reason why and proceed with request.) (Agent: If yes, when and what did the pharmacy advise?)  Is this the correct pharmacy for this prescription? Yes If no, delete pharmacy and type the correct one.  This is the patient's preferred pharmacy:  [facility] - [place], [place] - [address] AT Portal to Registered [facility] [address] [place] [address] Phone: [phone] Fax: [phone]   Has the prescription been filled recently? Yes  Is the patient out of the medication? Yes  Has the patient been seen for an appointment in the last year OR does the patient have an upcoming appointment? Yes  Can we respond through MyChart? Yes  Agent: Please be advised that Rx refills may take up to 3 business days. We ask that you follow-up with your pharmacy.

## 2023-12-22 ENCOUNTER — Other Ambulatory Visit: Payer: Self-pay | Admitting: Cardiology

## 2023-12-27 ENCOUNTER — Other Ambulatory Visit: Payer: Self-pay | Admitting: Family Medicine

## 2023-12-27 MED ORDER — METFORMIN HCL ER 500 MG PO TB24: 500.0000 mg | ORAL_TABLET | Freq: Every day | ORAL | 0 refills | Status: DC

## 2023-12-27 NOTE — Telephone Encounter (Signed)
Copied from CRM [id number]. Topic: Clinical - Medication Refill >> Dec 27, 2023  3:58 PM Isabell A wrote: Patient leaving for [place] tomorrow, requesting his refill today.    Most Recent Primary Care Visit:  Provider: Seabron Spates R  Department: [facility]  Visit Type: MYCHART VIDEO VISIT  Date: 07/12/2023  Medication: metFORMIN (GLUCOPHAGE-XR) 500 MG 24 hr tablet  Has the patient contacted their pharmacy? Yes (Agent: If no, request that the patient contact the pharmacy for the refill. If patient does not wish to contact the pharmacy document the reason why and proceed with request.) (Agent: If yes, when and what did the pharmacy advise?)  Is this the correct pharmacy for this prescription? Yes If no, delete pharmacy and type the correct one.  This is the patient's preferred pharmacy:   [id number] [facility], [place] - [address] [place] [address] Phone: [phone] Fax: [phone]   Has the prescription been filled recently? Yes  Is the patient out of the medication? Yes  Has the patient been seen for an appointment in the last year OR does the patient have an upcoming appointment? Yes  Can we respond through MyChart? No  Agent: Please be advised that Rx refills may take up to 3 business days. We ask that you follow-up with your pharmacy.

## 2024-01-25 ENCOUNTER — Encounter: Payer: Self-pay | Admitting: Family Medicine

## 2024-01-25 ENCOUNTER — Ambulatory Visit (INDEPENDENT_AMBULATORY_CARE_PROVIDER_SITE_OTHER): Payer: 59 | Admitting: Family Medicine

## 2024-01-25 VITALS — BP 134/78 | HR 62 | Resp 20 | Ht 64.0 in | Wt 162.0 lb

## 2024-01-25 DIAGNOSIS — E1165 Type 2 diabetes mellitus with hyperglycemia: Secondary | ICD-10-CM

## 2024-01-25 DIAGNOSIS — Z1211 Encounter for screening for malignant neoplasm of colon: Secondary | ICD-10-CM

## 2024-01-25 DIAGNOSIS — E039 Hypothyroidism, unspecified: Secondary | ICD-10-CM | POA: Diagnosis not present

## 2024-01-25 DIAGNOSIS — Z125 Encounter for screening for malignant neoplasm of prostate: Secondary | ICD-10-CM

## 2024-01-25 DIAGNOSIS — Z0000 Encounter for general adult medical examination without abnormal findings: Secondary | ICD-10-CM

## 2024-01-25 NOTE — Patient Instructions (Addendum)
 Give [name] 2-3 business days to get the results of your labs back.   Keep the diet clean and stay active.  Please get me a copy of your advanced directive form at your convenience.   The Shingrix vaccine (for shingles) is a 2 shot series spaced 2-6 months apart. It can make people feel low energy, achy and almost like they have the flu for 48 hours after injection. 1/5 people can have nausea and/or vomiting. Please plan accordingly when deciding on when to get this shot. Call our office for a nurse visit appointment to get this. The second shot of the series is less severe regarding the side effects, but it still lasts 48 hours.   Please schedule your eye exam.   If you do not hear anything about your referral in the next 1-2 weeks, call our office and ask for an update.  Let us know if you need anything.

## 2024-01-25 NOTE — Progress Notes (Signed)
 Chief Complaint  Patient presents with   Annual Exam    Well Male Troy Valentine is here for a complete physical.   His last physical was >1 year ago.  Current diet: in general, a "healthy" diet.  Current exercise: walking Weight trend: stable Fatigue out of ordinary? No. Seat belt? Yes.   Advanced directive? No  Health maintenance Shingrix- No Colonoscopy- No Tetanus- Yes HIV- Yes Hep C- Yes   Past Medical History:  Diagnosis Date   Allergy    Cardiac murmur 12/09/2021   Encounter for long-term (current) use of other medications 09/13/2012   Essential hypertension 09/13/2012   Gastroesophageal reflux disease without esophagitis 02/19/2016   History of aortic coarctation repair 02/19/2016   Hyperlipidemia LDL goal <100 09/13/2012   Hypothyroidism 09/13/2012   Type 2 diabetes mellitus with hyperglycemia, without long-term current use of insulin (HCC) 09/13/2012      Past Surgical History:  Procedure Laterality Date   COARCTATION OF AORTA EXCISION      Medications  Current Outpatient Medications on File Prior to Visit  Medication Sig Dispense Refill   aspirin 81 MG tablet Take 81 mg by mouth daily.     atorvastatin (LIPITOR) 20 MG tablet Take 1 tablet (20 mg total) by mouth daily. 90 tablet 3   hydrochlorothiazide (MICROZIDE) 12.5 MG capsule Take 1 capsule (12.5 mg total) by mouth daily. 90 capsule 1   levothyroxine (SYNTHROID) 88 MCG tablet Take 1 tablet (88 mcg total) by mouth daily before breakfast. 90 tablet 3   lisinopril (ZESTRIL) 20 MG tablet Take 1 tablet (20 mg total) by mouth daily. 90 tablet 3   metFORMIN (GLUCOPHAGE-XR) 500 MG 24 hr tablet Take 1 tablet (500 mg total) by mouth daily with breakfast. 90 tablet 0   metoprolol succinate (TOPROL-XL) 50 MG 24 hr tablet Take 1 tablet (50 mg total) by mouth daily. 90 tablet 3    Allergies No Known Allergies  Family History Family History  Problem Relation Age of Onset   Hypertension Mother    Kidney  disease Mother     Review of Systems: Constitutional:  no fevers Eye:  no recent significant change in vision Ear/Nose/Mouth/Throat:  Ears:  no hearing loss Nose/Mouth/Throat:  no complaints of nasal congestion, no sore throat Cardiovascular:  no chest pain Respiratory:  no shortness of breath Gastrointestinal:  no change in bowel habits GU:  Male: negative for dysuria, frequency Musculoskeletal/Extremities:  no joint pain Integumentary (Skin/Breast):  no abnormal skin lesions reported Neurologic:  no headaches Endocrine: No unexpected weight changes Hematologic/Lymphatic:  no abnormal bleeding  Exam BP 134/78   Pulse 62   Resp 20   Ht 5\' 4"  (1.626 m)   Wt 162 lb (73.5 kg)   SpO2 100%   BMI 27.81 kg/m  General:  well developed, well nourished, in no apparent distress Skin:  no significant moles, warts, or growths Head:  no masses, lesions, or tenderness Eyes:  pupils equal and round, sclera anicteric without injection Ears:  canals without lesions, TMs shiny without retraction, no obvious effusion, no erythema Nose:  nares patent, mucosa normal Throat/Pharynx:  lips and gingiva without lesion; tongue and uvula midline; non-inflamed pharynx; no exudates or postnasal drainage Neck: neck supple without adenopathy, thyromegaly, or masses Cardiac: RRR, no bruits, no LE edema Lungs:  clear to auscultation, breath sounds equal bilaterally, no respiratory distress Abdomen: BS+, soft, non-tender, non-distended, no masses or organomegaly noted Rectal: Deferred Musculoskeletal:  symmetrical muscle groups noted without atrophy or deformity  Neuro:  gait normal; deep tendon reflexes normal and symmetric Psych: well oriented with normal range of affect and appropriate judgment/insight  Assessment and Plan  Well adult exam - Plan: CBC, Comprehensive metabolic panel, Lipid panel  Type 2 diabetes mellitus with hyperglycemia, without long-term current use of insulin (HCC) - Plan:  Hemoglobin A1c, Microalbumin / creatinine urine ratio  Screening for prostate cancer - Plan: PSA  Screen for colon cancer - Plan: Ambulatory referral to Gastroenterology   Well 63 y.o. male. Counseled on diet and exercise. Counseled on risks and benefits of prostate cancer screening with PSA. The patient agrees to undergo testing. Advanced directive form provided today.  CCS: Refer GI again.  Shingrix rec'd.  Rec'd he schedule his eye exam.  Immunizations, labs, and further orders as above. Follow up in 6 mo. The patient voiced understanding and agreement to the plan.  Jilda Roche Leshara, DO 01/25/24 7:49 AM

## 2024-01-26 ENCOUNTER — Other Ambulatory Visit: Payer: Self-pay | Admitting: Family Medicine

## 2024-01-26 ENCOUNTER — Encounter: Payer: Self-pay | Admitting: Family Medicine

## 2024-01-26 LAB — CBC
Hematocrit: 46.1 % (ref 37.5–51.0)
Hemoglobin: 15.8 g/dL (ref 13.0–17.7)
MCH: 29.9 pg (ref 26.6–33.0)
MCHC: 34.3 g/dL (ref 31.5–35.7)
MCV: 87 fL (ref 79–97)
Platelets: 280 10*3/uL (ref 150–450)
RBC: 5.28 x10E6/uL (ref 4.14–5.80)
RDW: 12.5 % (ref 11.6–15.4)
WBC: 9.6 10*3/uL (ref 3.4–10.8)

## 2024-01-26 LAB — COMPREHENSIVE METABOLIC PANEL
ALT: 30 IU/L (ref 0–44)
AST: 25 IU/L (ref 0–40)
Albumin: 4.3 g/dL (ref 3.9–4.9)
Alkaline Phosphatase: 83 IU/L (ref 44–121)
BUN/Creatinine Ratio: 12 (ref 10–24)
BUN: 11 mg/dL (ref 8–27)
Bilirubin Total: 0.7 mg/dL (ref 0.0–1.2)
CO2: 26 mmol/L (ref 20–29)
Calcium: 9.1 mg/dL (ref 8.6–10.2)
Chloride: 99 mmol/L (ref 96–106)
Creatinine, Ser: 0.94 mg/dL (ref 0.76–1.27)
Globulin, Total: 2.6 g/dL (ref 1.5–4.5)
Glucose: 159 mg/dL — ABNORMAL HIGH (ref 70–99)
Potassium: 4.1 mmol/L (ref 3.5–5.2)
Sodium: 138 mmol/L (ref 134–144)
Total Protein: 6.9 g/dL (ref 6.0–8.5)
eGFR: 92 mL/min/{1.73_m2} (ref >59)

## 2024-01-26 LAB — MICROALBUMIN / CREATININE URINE RATIO
Creatinine, Urine: 102.5 mg/dL
Microalb/Creat Ratio: 4 mg/g{creat} (ref 0–29)
Microalbumin, Urine: 3.7 ug/mL

## 2024-01-26 LAB — PSA: Prostate Specific Ag, Serum: 0.3 ng/mL (ref 0.0–4.0)

## 2024-01-26 LAB — T4, FREE: Free T4: 1.34 ng/dL (ref 0.82–1.77)

## 2024-01-26 LAB — HEMOGLOBIN A1C
Est. average glucose Bld gHb Est-mCnc: 186 mg/dL
Hgb A1c MFr Bld: 8.1 % — ABNORMAL HIGH (ref 4.8–5.6)

## 2024-01-26 LAB — TSH: TSH: 3.05 u[IU]/mL (ref 0.450–4.500)

## 2024-01-26 LAB — LIPID PANEL
Chol/HDL Ratio: 3.9 ratio (ref 0.0–5.0)
Cholesterol, Total: 150 mg/dL (ref 100–199)
HDL: 38 mg/dL — ABNORMAL LOW (ref >39)
LDL Chol Calc (NIH): 80 mg/dL (ref 0–99)
Triglycerides: 188 mg/dL — ABNORMAL HIGH (ref 0–149)
VLDL Cholesterol Cal: 32 mg/dL (ref 5–40)

## 2024-01-26 MED ORDER — METFORMIN HCL ER 500 MG PO TB24: 1000.0000 mg | ORAL_TABLET | Freq: Every day | ORAL | 1 refills | Status: DC

## 2024-03-12 ENCOUNTER — Encounter: Payer: Self-pay | Admitting: Family Medicine

## 2024-03-24 ENCOUNTER — Other Ambulatory Visit: Payer: Self-pay | Admitting: Family Medicine

## 2024-05-01 ENCOUNTER — Ambulatory Visit: Admitting: Family Medicine

## 2024-05-14 ENCOUNTER — Encounter: Payer: Self-pay | Admitting: Family Medicine

## 2024-05-14 ENCOUNTER — Ambulatory Visit (INDEPENDENT_AMBULATORY_CARE_PROVIDER_SITE_OTHER): Admitting: Family Medicine

## 2024-05-14 VITALS — BP 128/74 | HR 80 | Temp 97.9°F | Resp 16 | Ht 64.0 in

## 2024-05-14 DIAGNOSIS — E782 Mixed hyperlipidemia: Secondary | ICD-10-CM | POA: Diagnosis not present

## 2024-05-14 DIAGNOSIS — Z7984 Long term (current) use of oral hypoglycemic drugs: Secondary | ICD-10-CM | POA: Diagnosis not present

## 2024-05-14 DIAGNOSIS — E1165 Type 2 diabetes mellitus with hyperglycemia: Secondary | ICD-10-CM

## 2024-05-14 MED ORDER — METFORMIN HCL ER 500 MG PO TB24: 1000.0000 mg | ORAL_TABLET | Freq: Every day | ORAL | Status: DC

## 2024-05-14 NOTE — Progress Notes (Signed)
 Subjective:   Chief Complaint  Patient presents with   Follow-up    Follow Up    Troy Valentine is a 63 y.o. male here for follow-up of diabetes.   Troy Valentine does not routinely check his sugars.  Patient does not require insulin.   Medications include: metformin  XR 1000 mg/d Diet is healthy.  Exercise: walking, replacing his hardwood floors No CP or SOB.   Hyperlipidemia Patient presents for mixed hyperlipidemia follow up. Currently being treated with Lipitor 20 mg/d and compliance with treatment thus far has been good. He denies myalgias. Diet/exercise as above.  The patient is not known to have coexisting coronary artery disease.  Past Medical History:  Diagnosis Date   Allergy    Cardiac murmur 12/09/2021   Encounter for long-term (current) use of other medications 09/13/2012   Essential hypertension 09/13/2012   Gastroesophageal reflux disease without esophagitis 02/19/2016   History of aortic coarctation repair 02/19/2016   Hyperlipidemia LDL goal <100 09/13/2012   Hypothyroidism 09/13/2012   Type 2 diabetes mellitus with hyperglycemia, without long-term current use of insulin (HCC) 09/13/2012     Related testing: Retinal exam: Due Pneumovax: done  Objective:  BP 128/74 (BP Location: Left Arm, Patient Position: Sitting)   Pulse 80   Temp 97.9 F (36.6 C) (Oral)   Resp 16   Ht 5' 4 (1.626 m)   SpO2 97%   BMI 27.81 kg/m  General:  Well developed, well nourished, in no apparent distress Lungs:  CTAB, no access msc use Cardio:  RRR, no bruits, no LE edema Psych: Age appropriate judgment and insight  Assessment:   Type 2 diabetes mellitus with hyperglycemia, without long-term current use of insulin (HCC) - Plan: Hemoglobin A1c Mixed hyperlipidemia - Plan: Lipid panel   Plan:   Chronic, unsure if stable.  For now continue metformin  XR 1000 g daily.  Counseled on diet and exercise. Chronic, hopefully stable.  Will continue Lipitor 20 mg daily.   Could consider dosage escalation versus adding a fibrate. [facility] contact number provided. F/u in 3-6 mo. The patient voiced understanding and agreement to the plan.  Mabel Mt Woodburn, DO 05/14/24 7:51 AM

## 2024-05-14 NOTE — Patient Instructions (Addendum)
 Please contact the GI team at: [phone]  Keep the diet clean and stay active.  Give us  2-3 business days to get the results of your labs back.   Claritin (loratadine), Allegra (fexofenadine), Zyrtec (cetirizine) which is also equivalent to Xyzal (levocetirizine); these are listed in order from weakest to strongest. Generic, and therefore cheaper, options are in the parentheses.   There are available OTC, and the generic versions, which may be cheaper, are in parentheses. Show this to a pharmacist if you have trouble finding any of these items.  Let us  know if you need anything.

## 2024-05-15 ENCOUNTER — Ambulatory Visit: Payer: Self-pay | Admitting: Family Medicine

## 2024-05-15 LAB — LIPID PANEL
Chol/HDL Ratio: 3 ratio (ref 0.0–5.0)
Cholesterol, Total: 127 mg/dL (ref 100–199)
HDL: 42 mg/dL (ref >39)
LDL Chol Calc (NIH): 65 mg/dL (ref 0–99)
Triglycerides: 108 mg/dL (ref 0–149)
VLDL Cholesterol Cal: 20 mg/dL (ref 5–40)

## 2024-05-15 LAB — HEMOGLOBIN A1C
Est. average glucose Bld gHb Est-mCnc: 148 mg/dL
Hgb A1c MFr Bld: 6.8 % — ABNORMAL HIGH (ref 4.8–5.6)

## 2024-07-27 ENCOUNTER — Ambulatory Visit: Admitting: Family Medicine

## 2024-07-28 ENCOUNTER — Other Ambulatory Visit: Payer: Self-pay | Admitting: Cardiology

## 2024-08-06 ENCOUNTER — Other Ambulatory Visit: Payer: Self-pay | Admitting: Cardiology

## 2024-08-13 ENCOUNTER — Other Ambulatory Visit: Payer: Self-pay | Admitting: Family Medicine

## 2024-08-31 ENCOUNTER — Telehealth: Payer: Self-pay | Admitting: Cardiology

## 2024-08-31 ENCOUNTER — Ambulatory Visit: Attending: Cardiology | Admitting: Cardiology

## 2024-08-31 ENCOUNTER — Encounter: Payer: Self-pay | Admitting: Cardiology

## 2024-08-31 ENCOUNTER — Other Ambulatory Visit (HOSPITAL_COMMUNITY): Payer: Self-pay

## 2024-08-31 VITALS — BP 116/78 | HR 62 | Ht 64.0 in | Wt 153.4 lb

## 2024-08-31 DIAGNOSIS — I10 Essential (primary) hypertension: Secondary | ICD-10-CM | POA: Diagnosis not present

## 2024-08-31 DIAGNOSIS — E1165 Type 2 diabetes mellitus with hyperglycemia: Secondary | ICD-10-CM

## 2024-08-31 DIAGNOSIS — E785 Hyperlipidemia, unspecified: Secondary | ICD-10-CM

## 2024-08-31 DIAGNOSIS — Z8774 Personal history of (corrected) congenital malformations of heart and circulatory system: Secondary | ICD-10-CM | POA: Diagnosis not present

## 2024-08-31 MED ORDER — HYDROCHLOROTHIAZIDE 12.5 MG PO CAPS
12.5000 mg | ORAL_CAPSULE | Freq: Every day | ORAL | 3 refills | Status: AC
  Filled 2024-08-31: qty 30, 30d supply, fill #0

## 2024-08-31 MED ORDER — METOPROLOL SUCCINATE ER 50 MG PO TB24
50.0000 mg | ORAL_TABLET | Freq: Every day | ORAL | 3 refills | Status: DC
  Filled 2024-08-31: qty 30, 30d supply, fill #0

## 2024-08-31 MED ORDER — ATORVASTATIN CALCIUM 20 MG PO TABS
20.0000 mg | ORAL_TABLET | Freq: Every day | ORAL | 3 refills | Status: AC
  Filled 2024-08-31: qty 30, 30d supply, fill #0

## 2024-08-31 NOTE — Progress Notes (Signed)
 Cardiology Office Note:    Date:  08/31/2024   ID:  Troy Valentine, DOB April 15, 1961, MRN [id number]  PCP:  Frann Mabel Mt, DO  Cardiologist:  Jennifer JONELLE Crape, MD   Referring MD: Frann Mabel Mt*    ASSESSMENT:    1. Essential hypertension   2. Type 2 diabetes mellitus with hyperglycemia, without long-term current use of insulin (HCC)   3. History of aortic coarctation repair   4. Hyperlipidemia LDL goal <100    PLAN:    In order of problems listed above:  Primary prevention stressed with the patient.  Importance of compliance with diet medication stressed and patient verbalized standing. He was advised to walk at least half an hour a day on a daily basis Essential hypertension: Blood pressure is stable and diet was emphasized. Mixed dyslipidemia: On lipid-lowering medications followed by primary care.  Last lipids were reviewed and are at goal. Diabetes mellitus: Stable hemoglobin A1c much better and he is able to eat. Coarctation of aorta: Postrepair.  Stable.  We will continue to monitor. Patient will be seen in follow-up appointment in 6 months or earlier if the patient has any concerns.    Medication Adjustments/Labs and Tests Ordered: Current medicines are reviewed at length with the patient today.  Concerns regarding medicines are outlined above.  Orders Placed This Encounter  Procedures   EKG 12-Lead   No orders of the defined types were placed in this encounter.    No chief complaint on file.    History of Present Illness:    Troy Valentine is a 63 y.o. male.  Patient has past medical history of coarctation of the aorta postrepair, essential hypertension, mixed dyslipidemia and diabetes mellitus.  He denies any problems at this time and takes care of activities of daily living.  No chest pain orthopnea or PND.  At the time of my evaluation, the patient is alert awake oriented and in no distress.  He does not exercise on a regular  basis.  Past Medical History:  Diagnosis Date   Allergy    Cardiac murmur 12/09/2021   COVID-19 07/12/2023   Encounter for long-term (current) use of other medications 09/13/2012   Essential hypertension 09/13/2012   Gastroesophageal reflux disease without esophagitis 02/19/2016   History of aortic coarctation repair 02/19/2016   Hyperlipidemia LDL goal <100 09/13/2012   Hypothyroidism 09/13/2012   Type 2 diabetes mellitus with hyperglycemia, without long-term current use of insulin (HCC) 09/13/2012    Past Surgical History:  Procedure Laterality Date   COARCTATION OF AORTA EXCISION      Current Medications: Current Meds  Medication Sig   aspirin 81 MG tablet Take 81 mg by mouth daily.   atorvastatin  (LIPITOR) 20 MG tablet TAKE 1 TABLET DAILY   hydrochlorothiazide  (MICROZIDE ) 12.5 MG capsule Take 1 capsule (12.5 mg total) by mouth daily. PLEASE MAKE AN APPOINTMENT IN ORDER TO RECEIVE ADDITIONAL REFILLS, FIRST ATTEMPT.   levothyroxine  (SYNTHROID ) 88 MCG tablet Take 1 tablet (88 mcg total) by mouth daily before breakfast.   lisinopril  (ZESTRIL ) 20 MG tablet Take 1 tablet (20 mg total) by mouth daily.   metFORMIN  (GLUCOPHAGE -XR) 500 MG 24 hr tablet TAKE 2 TABLETS DAILY WITH  BREAKFAST   metoprolol  succinate (TOPROL -XL) 50 MG 24 hr tablet Take 1 tablet (50 mg total) by mouth daily. PLEASE MAKE AN APPOINTMENT IN ORDER TO RECEIVE ADDITIONAL REFILLS, FIRST ATTEMPT.     Allergies:   Patient has no known allergies.   Social History  Socioeconomic History   Marital status: Married    Spouse name: Not on file   Number of children: Not on file   Years of education: Not on file   Highest education level: Not on file  Occupational History   Not on file  Tobacco Use   Smoking status: Never   Smokeless tobacco: Never  Substance and Sexual Activity   Alcohol use: No   Drug use: No   Sexual activity: Yes    Birth control/protection: None  Other Topics Concern   Not on file   Social History Narrative   Not on file   Social Drivers of Health   Financial Resource Strain: Not on file  Food Insecurity: Not on file  Transportation Needs: Not on file  Physical Activity: Not on file  Stress: Not on file  Social Connections: Not on file     Family History: The patient's family history includes Hypertension in his mother; Kidney disease in his mother.  ROS:   Please see the history of present illness.    All other systems reviewed and are negative.  EKGs/Labs/Other Studies Reviewed:    The following studies were reviewed today: .SABRAEKG Interpretation Date/Time:  Friday August 31 2024 07:57:03 EDT Ventricular Rate:  62 PR Interval:  126 QRS Duration:  82 QT Interval:  406 QTC Calculation: 412 R Axis:   40  Text Interpretation: Normal sinus rhythm Normal ECG When compared with ECG of 08-Jul-2023 08:02, Nonspecific T wave abnormality no longer evident in Inferior leads Nonspecific T wave abnormality has replaced inverted T waves in Lateral leads Confirmed by Edwyna Backers [phone]) on 08/31/2024 8:09:18 AM     Recent Labs: 01/25/2024: ALT 30; BUN 11; Creatinine, Ser 0.94; Hemoglobin 15.8; Platelets 280; Potassium 4.1; Sodium 138; TSH 3.050  Recent Lipid Panel    Component Value Date/Time   CHOL 127 05/14/2024 0752   TRIG 108 05/14/2024 0752   HDL 42 05/14/2024 0752   CHOLHDL 3.0 05/14/2024 0752   CHOLHDL 3 09/14/2021 0729   VLDL 26.8 09/14/2021 0729   LDLCALC 65 05/14/2024 0752   LDLCALC 66 09/05/2020 0732    Physical Exam:    VS:  BP 116/78   Pulse 62   Ht 5' 4 (1.626 m)   Wt 153 lb 6.4 oz (69.6 kg)   SpO2 99%   BMI 26.33 kg/m     Wt Readings from Last 3 Encounters:  08/31/24 153 lb 6.4 oz (69.6 kg)  01/25/24 162 lb (73.5 kg)  07/08/23 160 lb (72.6 kg)     GEN: Patient is in no acute distress HEENT: Normal NECK: No JVD; No carotid bruits LYMPHATICS: No lymphadenopathy CARDIAC: Hear sounds regular, 2/6 systolic murmur at the  apex. RESPIRATORY:  Clear to auscultation without rales, wheezing or rhonchi  ABDOMEN: Soft, non-tender, non-distended MUSCULOSKELETAL:  No edema; No deformity  SKIN: Warm and dry NEUROLOGIC:  Alert and oriented x 3 PSYCHIATRIC:  Normal affect   Signed, Backers JONELLE Edwyna, MD  08/31/2024 8:11 AM     [facility]

## 2024-08-31 NOTE — Patient Instructions (Signed)

## 2024-08-31 NOTE — Telephone Encounter (Signed)
*  STAT* If patient is at the pharmacy, call can be transferred to refill team.   1. Which medications need to be refilled? (please list name of each medication and dose if known) metoprolol succinate (TOPROL-XL) 50 MG 24 hr tablet   2. Which pharmacy/location (including street and city if local pharmacy) is medication to be sent to? [facility] - [place], [place] - [address] [facility] AT Portal to Reg[facility] Phone: [phone]  Fax: [phone]     3. Do they need a 30 day or 90 day supply? 90

## 2024-08-31 NOTE — Telephone Encounter (Signed)
 RX sent

## 2024-09-09 ENCOUNTER — Other Ambulatory Visit: Payer: Self-pay | Admitting: Cardiology

## 2024-09-11 ENCOUNTER — Other Ambulatory Visit (HOSPITAL_COMMUNITY): Payer: Self-pay

## 2024-09-12 ENCOUNTER — Other Ambulatory Visit (HOSPITAL_COMMUNITY): Payer: Self-pay

## 2024-11-01 ENCOUNTER — Other Ambulatory Visit: Payer: Self-pay | Admitting: Family Medicine

## 2024-11-19 ENCOUNTER — Ambulatory Visit: Admitting: Family Medicine

## 2024-11-19 ENCOUNTER — Encounter: Payer: Self-pay | Admitting: Family Medicine

## 2024-11-19 VITALS — BP 120/80 | HR 72 | Temp 98.0°F | Resp 16 | Ht 64.0 in | Wt 155.0 lb

## 2024-11-19 DIAGNOSIS — E1165 Type 2 diabetes mellitus with hyperglycemia: Secondary | ICD-10-CM | POA: Diagnosis not present

## 2024-11-19 DIAGNOSIS — Z1211 Encounter for screening for malignant neoplasm of colon: Secondary | ICD-10-CM

## 2024-11-19 DIAGNOSIS — Z7984 Long term (current) use of oral hypoglycemic drugs: Secondary | ICD-10-CM | POA: Diagnosis not present

## 2024-11-19 DIAGNOSIS — I10 Essential (primary) hypertension: Secondary | ICD-10-CM | POA: Diagnosis not present

## 2024-11-19 DIAGNOSIS — E039 Hypothyroidism, unspecified: Secondary | ICD-10-CM | POA: Diagnosis not present

## 2024-11-19 MED ORDER — LISINOPRIL 20 MG PO TABS: 20.0000 mg | ORAL_TABLET | Freq: Every day | ORAL | 3 refills | Status: AC

## 2024-11-19 MED ORDER — METFORMIN HCL ER 500 MG PO TB24: 1000.0000 mg | ORAL_TABLET | Freq: Every day | ORAL | 3 refills | Status: AC

## 2024-11-19 MED ORDER — LEVOTHYROXINE SODIUM 88 MCG PO TABS: 88.0000 ug | ORAL_TABLET | Freq: Every day | ORAL | 3 refills | Status: AC

## 2024-11-19 NOTE — Progress Notes (Signed)
 Subjective:   Chief Complaint  Patient presents with   Diabetes    Diabetes Check    Alyan Guerry is a 64 y.o. male here for follow-up of diabetes.   Natalie does not routinely ck his sugars.  Patient does not require insulin.   Medications include: metformin  XR 1000 mg/d Diet is usually healthy.  Exercise: walking  Hypertension Patient presents for hypertension follow up. He does not monitor home blood pressures. He is compliant with medications- lisinopril  20 mg/d, Toprol  XL 50 mg/d. Patient has these side effects of medication: none Diet/exercise as above. No CP or SOB.   Hypothyroidism Patient presents for follow-up of hypothyroidism.  Reports compliance with medication- Synthroid  88 mcg/d. Current symptoms include: denies fatigue, weight changes, heat/cold intolerance, bowel/skin changes or CVS symptoms He believes his dose should be unchanged  Past Medical History:  Diagnosis Date   Allergy    Cardiac murmur 12/09/2021   COVID-19 07/12/2023   Encounter for long-term (current) use of other medications 09/13/2012   Essential hypertension 09/13/2012   Gastroesophageal reflux disease without esophagitis 02/19/2016   History of aortic coarctation repair 02/19/2016   Hyperlipidemia LDL goal <100 09/13/2012   Hypothyroidism 09/13/2012   Type 2 diabetes mellitus with hyperglycemia, without long-term current use of insulin (HCC) 09/13/2012     Related testing: Retinal exam: Done Pneumovax: done  Objective:  BP 120/80 (BP Location: Left Arm, Patient Position: Sitting)   Pulse 72   Temp 98 F (36.7 C)   Resp 16   Ht 5' 4 (1.626 m)   Wt 155 lb (70.3 kg)   SpO2 97%   BMI 26.61 kg/m  General:  Well developed, well nourished, in no apparent distress Lungs:  CTAB, no access msc use Cardio:  RRR, no bruits, no LE edema Psych: Age appropriate judgment and insight  Assessment:   Type 2 diabetes mellitus with hyperglycemia, without long-term current use  of insulin (HCC) - Plan: Comprehensive metabolic panel with GFR, Lipid panel, Hemoglobin A1c  Essential hypertension  Hypothyroidism, unspecified type - Plan: TSH, T4, free  Screen for colon cancer - Plan: Ambulatory referral to Gastroenterology   Plan:   Chronic, stable.  Continue metformin  XR 1000 mg daily.  Counseled on diet and exercise. Chronic, stable.  Continue lisinopril  20 mg daily, Toprol -XL 50 mg daily, hydrochlorothiazide  12.5 mg daily. Chronic, stable.  Continue Synthroid  88 mcg daily. Refer to gastroenterology again. F/u in 6 mo. The patient voiced understanding and agreement to the plan.  Mabel Mt Mucarabones, DO 11/19/2024 7:40 AM

## 2024-11-19 NOTE — Addendum Note (Signed)
 Addended by: TRUDY CURVIN RAMAN on: 11/19/2024 07:42 AM   Modules accepted: Orders

## 2024-11-19 NOTE — Patient Instructions (Addendum)
Give us 2-3 business days to get the results of your labs back.   Keep the diet clean and stay active.  If you do not hear anything about your referral in the next 1-2 weeks, call our office and ask for an update.  Let us know if you need anything. 

## 2024-11-20 ENCOUNTER — Ambulatory Visit: Payer: Self-pay | Admitting: Family Medicine

## 2024-11-20 LAB — COMPREHENSIVE METABOLIC PANEL WITH GFR
ALT: 17 IU/L (ref 0–44)
AST: 17 IU/L (ref 0–40)
Albumin: 4.4 g/dL (ref 3.9–4.9)
Alkaline Phosphatase: 69 IU/L (ref 47–123)
BUN/Creatinine Ratio: 11 (ref 10–24)
BUN: 11 mg/dL (ref 8–27)
Bilirubin Total: 0.9 mg/dL (ref 0.0–1.2)
CO2: 27 mmol/L (ref 20–29)
Calcium: 9.3 mg/dL (ref 8.6–10.2)
Chloride: 101 mmol/L (ref 96–106)
Creatinine, Ser: 0.97 mg/dL (ref 0.76–1.27)
Globulin, Total: 2.3 g/dL (ref 1.5–4.5)
Glucose: 149 mg/dL — ABNORMAL HIGH (ref 70–99)
Potassium: 4.2 mmol/L (ref 3.5–5.2)
Sodium: 144 mmol/L (ref 134–144)
Total Protein: 6.7 g/dL (ref 6.0–8.5)
eGFR: 88 mL/min/1.73

## 2024-11-20 LAB — TSH: TSH: 0.466 u[IU]/mL (ref 0.450–4.500)

## 2024-11-20 LAB — LIPID PANEL
Chol/HDL Ratio: 3.1 ratio (ref 0.0–5.0)
Cholesterol, Total: 151 mg/dL (ref 100–199)
HDL: 48 mg/dL
LDL Chol Calc (NIH): 79 mg/dL (ref 0–99)
Triglycerides: 138 mg/dL (ref 0–149)
VLDL Cholesterol Cal: 24 mg/dL (ref 5–40)

## 2024-11-20 LAB — T4, FREE: Free T4: 1.61 ng/dL (ref 0.82–1.77)

## 2024-11-20 LAB — HEMOGLOBIN A1C
Est. average glucose Bld gHb Est-mCnc: 140 mg/dL
Hgb A1c MFr Bld: 6.5 % — ABNORMAL HIGH (ref 4.8–5.6)

## 2024-12-11 NOTE — Progress Notes (Signed)
 Troy Valentine                                          MRN: [id number]   12/11/2024   The [facility] Quality Team Specialist reviewed this patient medical record for the purposes of chart review for care gap closure. The following were reviewed: chart review for care gap closure-colorectal cancer screening and diabetic eye exam.    [facility] Quality Team

## 2025-05-22 ENCOUNTER — Encounter: Admitting: Family Medicine
